# Patient Record
Sex: Male | Born: 1950 | Race: White | Hispanic: No | Marital: Married | Smoking: Never smoker
Health system: Southern US, Community
[De-identification: ages and names within clinical notes are randomized; demographics above are authoritative.]

## PROBLEM LIST (undated history)

## (undated) DIAGNOSIS — K635 Polyp of colon: Secondary | ICD-10-CM

## (undated) DIAGNOSIS — G4730 Sleep apnea, unspecified: Secondary | ICD-10-CM

## (undated) DIAGNOSIS — I10 Essential (primary) hypertension: Secondary | ICD-10-CM

## (undated) DIAGNOSIS — E119 Type 2 diabetes mellitus without complications: Secondary | ICD-10-CM

## (undated) DIAGNOSIS — E785 Hyperlipidemia, unspecified: Secondary | ICD-10-CM

## (undated) HISTORY — PX: COLONOSCOPY: SHX174

## (undated) HISTORY — DX: Polyp of colon: K63.5

## (undated) HISTORY — DX: Type 2 diabetes mellitus without complications: E11.9

---

## 2000-11-29 ENCOUNTER — Encounter: Admission: RE | Admit: 2000-11-29 | Discharge: 2000-11-29 | Payer: Self-pay | Admitting: Family Medicine

## 2000-11-29 ENCOUNTER — Encounter: Payer: Self-pay | Admitting: Family Medicine

## 2002-08-02 ENCOUNTER — Encounter: Payer: Self-pay | Admitting: Family Medicine

## 2002-08-02 ENCOUNTER — Encounter: Admission: RE | Admit: 2002-08-02 | Discharge: 2002-08-02 | Payer: Self-pay | Admitting: Family Medicine

## 2002-11-13 ENCOUNTER — Encounter: Admission: RE | Admit: 2002-11-13 | Discharge: 2002-11-13 | Payer: Self-pay | Admitting: Family Medicine

## 2002-11-13 ENCOUNTER — Encounter: Payer: Self-pay | Admitting: Family Medicine

## 2004-05-13 ENCOUNTER — Ambulatory Visit: Payer: Self-pay | Admitting: Unknown Physician Specialty

## 2004-05-13 ENCOUNTER — Ambulatory Visit (HOSPITAL_BASED_OUTPATIENT_CLINIC_OR_DEPARTMENT_OTHER): Admission: RE | Admit: 2004-05-13 | Discharge: 2004-05-13 | Payer: Self-pay | Admitting: Unknown Physician Specialty

## 2004-06-08 ENCOUNTER — Ambulatory Visit (HOSPITAL_BASED_OUTPATIENT_CLINIC_OR_DEPARTMENT_OTHER): Admission: RE | Admit: 2004-06-08 | Discharge: 2004-06-08 | Payer: Self-pay | Admitting: Unknown Physician Specialty

## 2004-06-15 ENCOUNTER — Ambulatory Visit: Payer: Self-pay | Admitting: Internal Medicine

## 2005-11-16 ENCOUNTER — Ambulatory Visit: Payer: Self-pay | Admitting: Internal Medicine

## 2005-12-07 ENCOUNTER — Ambulatory Visit: Payer: Self-pay | Admitting: Internal Medicine

## 2005-12-07 ENCOUNTER — Encounter (INDEPENDENT_AMBULATORY_CARE_PROVIDER_SITE_OTHER): Payer: Self-pay | Admitting: *Deleted

## 2009-01-16 ENCOUNTER — Encounter: Admission: RE | Admit: 2009-01-16 | Discharge: 2009-01-16 | Payer: Self-pay | Admitting: Family Medicine

## 2009-10-17 ENCOUNTER — Encounter: Admission: RE | Admit: 2009-10-17 | Discharge: 2009-10-17 | Payer: Self-pay | Admitting: Family Medicine

## 2010-03-04 ENCOUNTER — Encounter
Admission: RE | Admit: 2010-03-04 | Discharge: 2010-03-04 | Payer: Self-pay | Source: Home / Self Care | Attending: Family Medicine | Admitting: Family Medicine

## 2010-04-01 ENCOUNTER — Encounter
Admission: RE | Admit: 2010-04-01 | Discharge: 2010-04-01 | Payer: Self-pay | Source: Home / Self Care | Attending: Chiropractic Medicine | Admitting: Chiropractic Medicine

## 2010-07-18 NOTE — Procedures (Signed)
NAME:  Zachary Dunn, Zachary Dunn             ACCOUNT NO.:  [id number]   MEDICAL RECORD NO.:  [id number]          PATIENT TYPE:  OUT   LOCATION:  SLEEP CENTER                 FACILITY:  [facility]   PHYSICIAN:  Clinton D. Maple Hudson, M.D. DATE OF BIRTH:  [birth year]   DATE OF STUDY:  05/13/2004                              NOCTURNAL POLYSOMNOGRAM   REFERRING PHYSICIAN:  Dr. Theadora Rama.   INDICATIONS FOR STUDY:  Hypersomnia with sleep apnea. Epworth sleepiness  score 9/24, BMI 38, weight 238 pounds.   SLEEP ARCHITECTURE:  Short sleep time 245 minutes with sleep efficiency of  58%. Stage I was 7%, stage II was 85%, stages III and IV were absent, REM  was 7% of total sleep time. Sleep latency 58 minutes. REM latency 145  minutes. Awake after sleep onset 123 minutes. Arousal index 41, which is  increased. The patient did not bring Ambien which he usually takes for sleep  and reported that he could not sleep supine without it.   RESPIRATORY DATA:  Respiratory disturbance index (RDI) 13 per hour  indicating mild obstructive sleep apnea/hypopnea syndrome before CPAP  indicating mild obstructive sleep apnea/hypopnea syndrome. This included 11  obstructive apneas and 42 hypopneas. The events were not positional. REM RDI  of 46.  CPAP titration by split protocol could not utilized on this night  because of insufficiency early events to meet protocol criteria.   OXYGEN DATA:  Moderate to loud snoring with oxygen desaturation to a nadir  of 87%. Mean oxygen saturation through the study was 94% on room air.   CARDIAC DATA:  Normal sinus rhythm.   MOVEMENT/PARASOMNIA:  A total of 125 limb jerks were recorded of which 36  were associated with arousal or awakening for a periodic limb movement with  arousal index of 8.8 per hour.   IMPRESSION/RECOMMENDATIONS:  1.  Mild obstructive sleep apnea/hypopnea syndrome, RDI of 13 per hour with      moderate snoring and oxygen desaturation to 87%.  2.  Periodic limb  movement with arousal, 8.8 per hour.  3.  Consider return for CPAP titration or evaluate for alternative sleep      apnea therapies as appropriate.    CDY/MEDQ  D:  05/18/2004 11:52:07  T:  05/19/2004 09:53:56  Job:  [id number]

## 2010-07-18 NOTE — Procedures (Signed)
NAME:  Zachary Dunn, Zachary Dunn             ACCOUNT NO.:  [id number]   MEDICAL RECORD NO.:  [id number]          PATIENT TYPE:  OUT   LOCATION:  SLEEP CENTER                 FACILITY:  [facility]   PHYSICIAN:  Clinton D. Maple Hudson, M.D. DATE OF BIRTH:  1950-04-06   DATE OF STUDY:  06/08/2004                              NOCTURNAL POLYSOMNOGRAM   STUDY DATE:  June 08, 2004   REFERRING PHYSICIAN:  Dr. Theadora Rama   INDICATION FOR STUDY:  Hypersomnia with sleep apnea.  Epworth Sleepiness  Score 6/24, BMI 42, weight 294 pounds.  Baseline diagnostic NPSG on May 13, 2004 recorded RDI of 13 per hour.  CPAP titration is requested.   SLEEP ARCHITECTURE:  Total sleep time 280 minutes (76%).  Stage I was 6%,  stage II 69%, stages III and IV were absent, REM was 26% of total sleep  time.  Sleep latency 47 minutes, REM latency 65 minutes, awake after sleep  onset 37 minutes, arousal index increased at 46.   SLEEP ARCHITECTURE:  CPAP titration protocol.  CPAP was titrated to 11 CWP,  RDI 6.5 per hour using a large ResMed Ultra Mirage Mask with heated  humidifier.   OXYGEN DATA:  Snoring was eliminated at 11 CWP.  Mean oxygen saturation on  CPAP was 95-98%.   CARDIAC DATA:  Normal sinus rhythm.   MOVEMENT/PARASOMNIA:  A total of 196 limb jerks were recorded of which 60  were associated with arousal or awakening for a periodic limb movement with  arousal index of 12.9 per hour which is increased.   IMPRESSION/RECOMMENDATION:  1.  Successful continuous positive airway pressure titration to 11 CWP,      respiratory disturbance index 6.5 per hour, using a large ResMed Ultra      Mirage Mask with heated humidifier.  2.  Baseline diagnostic nocturnal polysomnogram on May 13, 2004 recorded      respiratory disturbance index of 13 per hour.  3.  Periodic limb movement with arousal, 12.9 per hour.  This can be      evaluated clinically after time for      accommodation to home continuous positive airway  pressure.  If it      persists then consider specific treatment with clonazepam or Requip.      CDY/MEDQ  D:  06/15/2004 14:47:54  T:  06/15/2004 19:07:10  Job:  [id number]

## 2011-01-21 ENCOUNTER — Encounter: Payer: Self-pay | Admitting: Internal Medicine

## 2011-03-19 ENCOUNTER — Encounter: Payer: BC Managed Care – PPO | Attending: Internal Medicine | Admitting: Dietician

## 2011-03-19 DIAGNOSIS — E119 Type 2 diabetes mellitus without complications: Secondary | ICD-10-CM | POA: Insufficient documentation

## 2011-03-19 DIAGNOSIS — Z713 Dietary counseling and surveillance: Secondary | ICD-10-CM | POA: Insufficient documentation

## 2011-03-19 NOTE — Progress Notes (Signed)
  Medical Nutrition Therapy:  Appt start time: 1115 end time:  1225  Assessment:  Primary concerns today: Increased A1C or 7.6%. History of type 2 DM for 7-8 months.  Has seen an increase in his A1C level over that period.  Primary MD referred to endocrinologist, who when he saw him recommended a visit with the CDE.  He has been doing Weight Watchers for the last 4 years.  He does not attend the meetings but, follows his point program and regularly weights himself at home.  He has lost approximately 30 lbs over this period.  He has not been ask to check his blood glucose on a regular basis.  He has bought a meter and will check occasionally.  Has family history of Father who developed DM 2 late in life.  Has been on the Janumet for the last 6 months.  MEDICATIONS: DM= Janumet 50/1000 BID Cholesterol= Lipitor.  DIETARY INTAKE:  24-hr recall:  B (8:00 AM): Oatmeal 1 cup (stee cut) banana or 1/2 grapefruit OR [place] with egg beater, with mushrooms onion, green pepper, 12 regular bagel and 2 cup black coffee.  Snk (mid AM) :none  L (mid PM): Salad OR leftovers Celery 4-5 sticks with low fat PB and a cup of tomato soup, V8 juice 8 oz,  Drink 4 bottles (16 oz ) each day.  Snk (mid PM): Usually no snacks. D (6:30-7:00 PM): Boiled shrimp,  8 med. Shrimp, salad (side) vinegar and olive oil, slice of ww bread with butter.  Snk (Bedtime PM): Usually 1/2 can no sugar added peaches. Beverages: water,   coffee and skim 2 cups per day   Recent physical activity: Goes to the gym 3-4 days per week and does primarily aerobic and stretching exercises  Estimated energy needs: Ht: 71 in  Wt: 265.9 lb BMI: 37.2 %  Adj WT: 207 lb   1800-1900 calories 200-205 g carbohydrates 130-135 g protein 48-50 g fat  Progress Towards Goal(s):  In progress.   Nutritional Diagnosis:  Cygnet-2.1 Inpaired nutrition utilization As related to glucose and carbohydrate imbalance.  As evidenced by A1C increased to 7.6% with diagnosis of  DM type 2.    Intervention:  Nutrition To continue to monitor food intake especially carbohydrate ntake.  When using Weight Watchers, do not limit the points and carbs during the day and then use or spend them with a large dinner meal and heavy evening snacking. Exercise:  Increase exercise to 150 minutes per week.  Talk to MD regarding regularly checking blood glucose levels.  Check to see what meals and exercise are doing to your blood glucose levels.  Handouts given during visit include:  Living Well With Diabetes  Novo Nordisk Carbohydrate Counting Guide  Handout for glucose level guide , exercise, meal planning and interventions.   Monitoring/Evaluation:  Dietary intake, exercise, blood glucose, and body weight in the next 6 months or he can call with questions in the meantime.

## 2011-03-19 NOTE — Patient Instructions (Signed)
   Check your glucose more often,  Vary the times during the day.  At the 2:30 Am hunger time, check your glucose before you eat.  Record you glucose levels and continue to try to correlate the glucose with food, activity, stress, medications.   Try not to save the points for 1 large meal.  Rice try [facility] ric, keep at 1 cup for meal (lean protein, 1-2 of non-starchy veggies, small serving of fat,  1 starch serving at the meal.  Keep the bedtime snack .   Talk to pharmacist for grapefruit interaction with meds.   Try to keep the carbs at 60 gnm for meals and 1 gm for snacks.

## 2011-03-21 ENCOUNTER — Encounter: Payer: Self-pay | Admitting: Dietician

## 2011-10-07 ENCOUNTER — Emergency Department (HOSPITAL_COMMUNITY)
Admission: EM | Admit: 2011-10-07 | Discharge: 2011-10-07 | Disposition: A | Discharge status: Home / Self Care | Payer: BC Managed Care – PPO | Attending: Emergency Medicine | Admitting: Emergency Medicine

## 2011-10-07 ENCOUNTER — Encounter (HOSPITAL_COMMUNITY): Payer: Self-pay | Admitting: Emergency Medicine

## 2011-10-07 DIAGNOSIS — IMO0002 Reserved for concepts with insufficient information to code with codable children: Secondary | ICD-10-CM

## 2011-10-07 DIAGNOSIS — I10 Essential (primary) hypertension: Secondary | ICD-10-CM | POA: Insufficient documentation

## 2011-10-07 DIAGNOSIS — S61409A Unspecified open wound of unspecified hand, initial encounter: Secondary | ICD-10-CM | POA: Insufficient documentation

## 2011-10-07 DIAGNOSIS — W260XXA Contact with knife, initial encounter: Secondary | ICD-10-CM | POA: Insufficient documentation

## 2011-10-07 HISTORY — DX: Essential (primary) hypertension: I10

## 2011-10-07 MED ORDER — IBUPROFEN 800 MG PO TABS
800.0000 mg | ORAL_TABLET | Freq: Once | ORAL | Status: AC
Start: 2011-10-07 — End: 2011-10-07
  Administered 2011-10-07: 800 mg via ORAL
  Filled 2011-10-07: qty 1

## 2011-10-07 MED ORDER — TETANUS-DIPHTH-ACELL PERTUSSIS 5-2.5-18.5 LF-MCG/0.5 IM SUSP
0.5000 mL | Freq: Once | INTRAMUSCULAR | Status: AC
  Administered 2011-10-07: 0.5 mL via INTRAMUSCULAR
  Filled 2011-10-07: qty 0.5

## 2011-10-07 NOTE — ED Notes (Signed)
Pt alert, arrives from home, c/o laceration to left hand, onset today, DSD in place, last tet 2007

## 2011-10-07 NOTE — ED Provider Notes (Signed)
History     CSN: [id number]  Arrival date & time 10/07/11  2014   First MD Initiated Contact with Patient 10/07/11 2157      Chief Complaint  Patient presents with  . Laceration  . Hand Pain    (Consider location/radiation/quality/duration/timing/severity/associated sxs/prior treatment) HPI Comments: Patient cut left hand between thumb and 2nd finger at around 1pm with rusty pocket knife. Rinsed with water right after. Patient states bleeding continued for approximately 2 hours afterwards. Tetanus last placed in 2007. No other treatments prior to arrival. Patient denies numbness, tingling, or weakness in his hand. No other injury. Onset was acute. Course constant. Nothing makes symptoms better or worse.  Patient is a 61 y.o. male presenting with skin laceration. The history is provided by the patient.  Laceration  The incident occurred 6 to 12 hours ago. The laceration is located on the left hand. The laceration is 1 cm in size. The laceration mechanism was a a dirty knife. The pain is mild. The pain has been constant since onset. He reports no foreign bodies present. His tetanus status is out of date.    Past Medical History  Diagnosis Date  . Hypertension     History reviewed. No pertinent past surgical history.  No family history on file.  History  Substance Use Topics  . Smoking status: Never Smoker   . Smokeless tobacco: Never Used  . Alcohol Use: No      Review of Systems  Constitutional: Negative for activity change.  Musculoskeletal: Negative for joint swelling and arthralgias.  Skin: Positive for wound.  Neurological: Negative for weakness and numbness.    Allergies  Review of patient's allergies indicates no known allergies.  Home Medications   Current Outpatient Rx  Name Route Sig Dispense Refill  . ASPIRIN 81 MG PO CHEW Oral Chew 81 mg by mouth daily.    . ATORVASTATIN CALCIUM 10 MG PO TABS Oral Take 10 mg by mouth daily.    Marland Kitchen LISINOPRIL 20 MG PO  TABS Oral Take 20 mg by mouth daily.    Marland Kitchen SITAGLIPTIN-METFORMIN HCL 50-1000 MG PO TABS Oral Take 1 tablet by mouth 2 (two) times daily with a meal.      BP 150/86  Pulse 98  Temp 98 F (36.7 C)  Resp 16  SpO2 99%  Physical Exam  Nursing note and vitals reviewed. Constitutional: He appears well-developed and well-nourished.  HENT:  Head: Normocephalic and atraumatic.  Eyes: Conjunctivae are normal.  Neck: Normal range of motion. Neck supple.  Pulmonary/Chest: No respiratory distress.  Musculoskeletal: He exhibits tenderness.       Left hand: He exhibits tenderness and laceration. He exhibits normal range of motion, no bony tenderness, normal capillary refill, no deformity and no swelling. normal sensation noted. Normal strength noted.       Hands:      Wound fully explored, wound depth approximately 1/4 of an inch. No foreign bodies seen or palpated.   Neurological: He is alert.  Skin: Skin is warm and dry.  Psychiatric: He has a normal mood and affect.    ED Course  Procedures (including critical care time)  Labs Reviewed - No data to display No results found.   1. Laceration     10:02 PM Patient seen and examined. Medications ordered.   Vital signs reviewed and are as follows: Filed Vitals:   10/07/11 2033  BP: 150/86  Pulse: 98  Temp: 98 F (36.7 C)  Resp: 16  LACERATION REPAIR Performed by: Carolee Rota Authorized by: Carolee Rota Consent: Verbal consent obtained. Risks and benefits: risks, benefits and alternatives were discussed Consent given by: patient Patient identity confirmed: provided demographic data Prepped and Draped in normal sterile fashion Wound explored  Laceration Location: L hand  Laceration Length: 1cm  No Foreign Bodies seen or palpated  Anesthesia: local infiltration  Local anesthetic: lidocaine 2% with epinephrine  Anesthetic total: 2 ml  Irrigation method: syringe with splash guard Amount of cleaning: standard,  500cc saline  Skin closure: 5-0 Ethilon  Number of sutures: 2  Technique: simple interrupted  Patient tolerance: Patient tolerated the procedure well with no immediate complications.  The patient was urged to return to the Emergency Department urgently with worsening pain, swelling, expanding erythema especially if it streaks away from the affected area, fever, or if they have any other concerns. Patient verbalized understanding.   Counseled patient on need for return and suture removal in 7-10 days.   MDM  Patient with 1 cm, shallow, non-puncture laceration of left hand with rusty pocket knife. Tetanus was updated. Wound was fully explored and irrigated with saline under pressure. No tendon, vascular, or neurological injury suspected during exam. No FB seen or palpated. Patient counseled on signs and symptoms to return. Wound repaired without immediate complication.       Renne Crigler, [place] 10/08/11 [id number]

## 2011-10-08 NOTE — ED Provider Notes (Signed)
Medical screening examination/treatment/procedure(s) were performed by non-physician practitioner and as supervising physician I was immediately available for consultation/collaboration.   Loren Racer, MD 10/08/11 [phone]

## 2011-11-25 ENCOUNTER — Encounter: Payer: Self-pay | Admitting: Internal Medicine

## 2012-01-20 IMAGING — CR DG CERVICAL SPINE COMPLETE 4+V
6 series · 6 of 6 positions shown · non-contrast
Comparison: Head CT 01/16/2009.

CLINICAL DATA: 59-year-old male with neck pain greater on the left.
No known injury.

CERVICAL SPINE - COMPLETE 4+ VIEW

[view not recorded (1 of 6)]
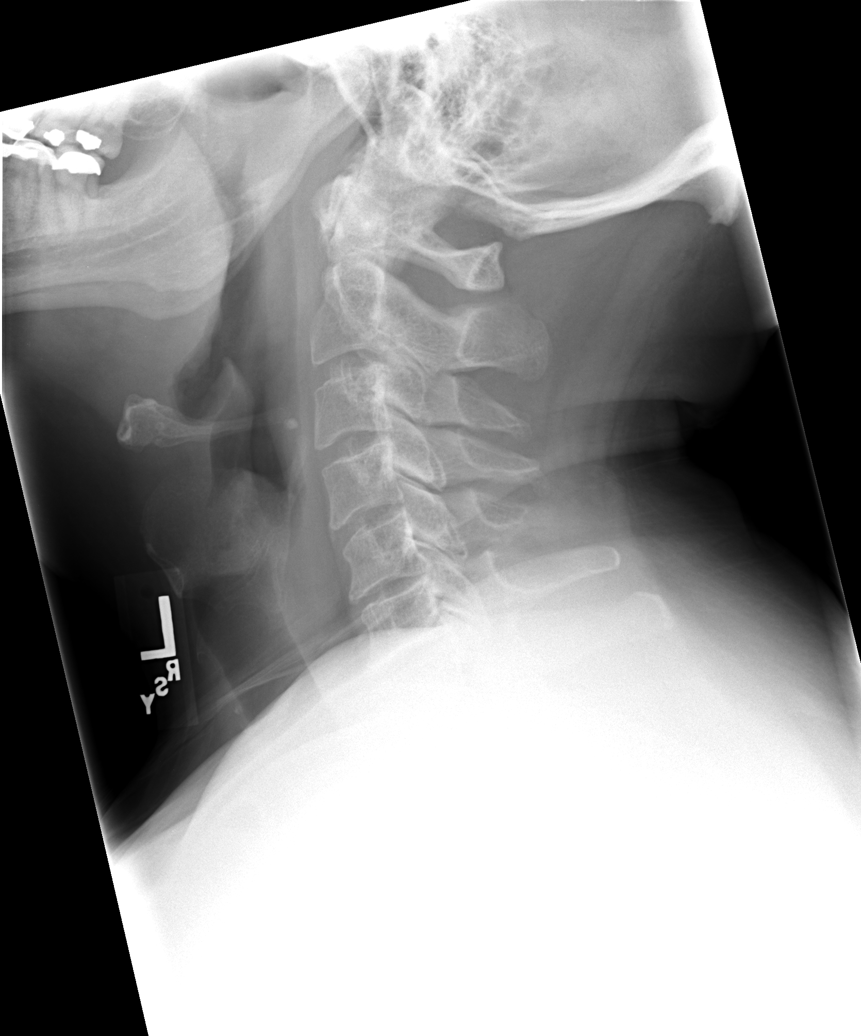

[view not recorded (2 of 6)]
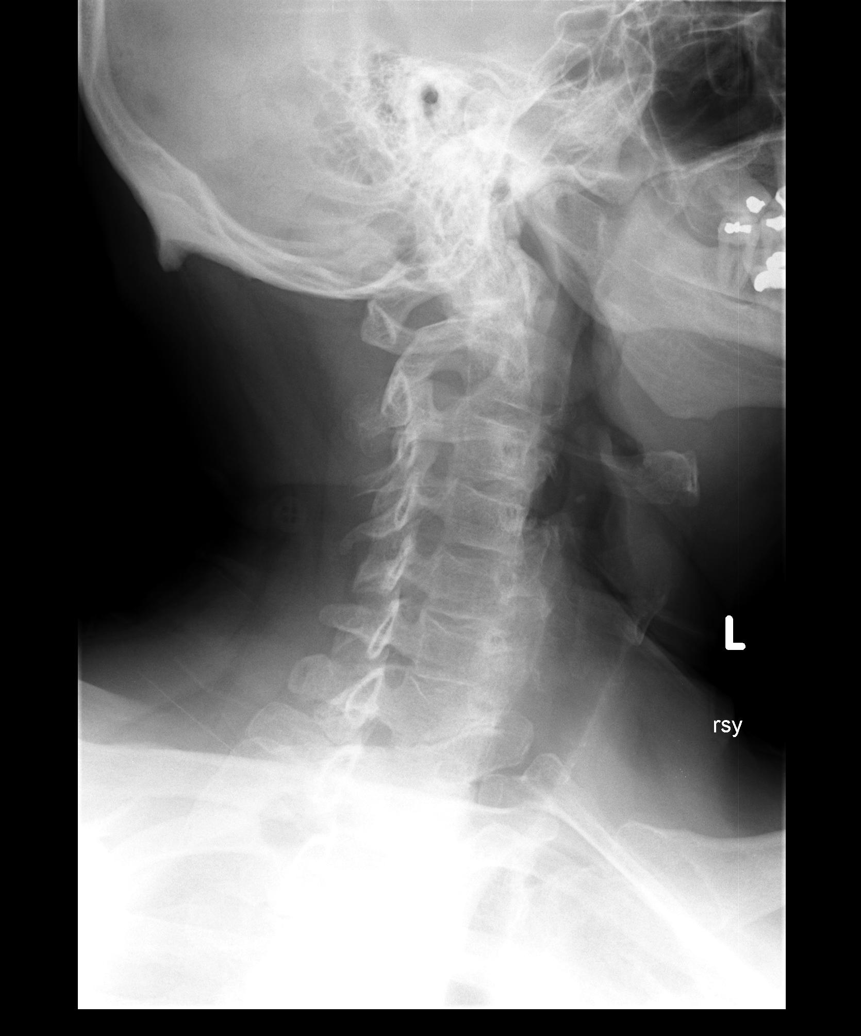

[view not recorded (3 of 6)]
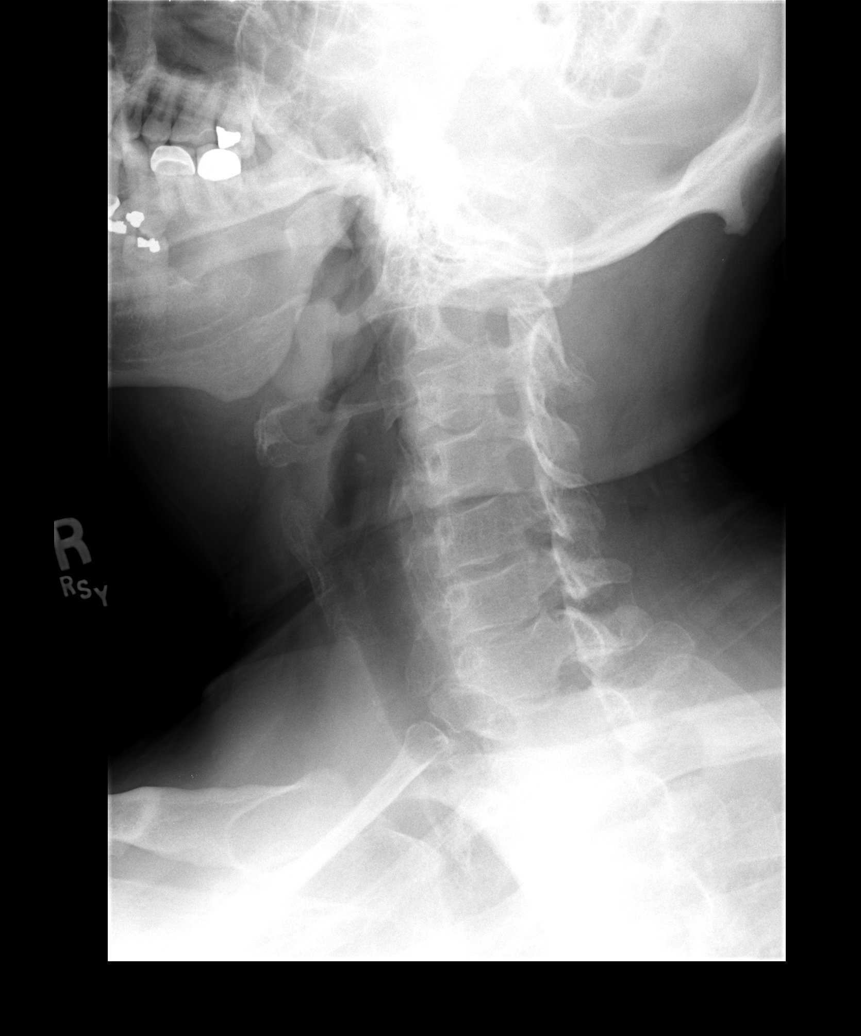

[view not recorded (4 of 6)]
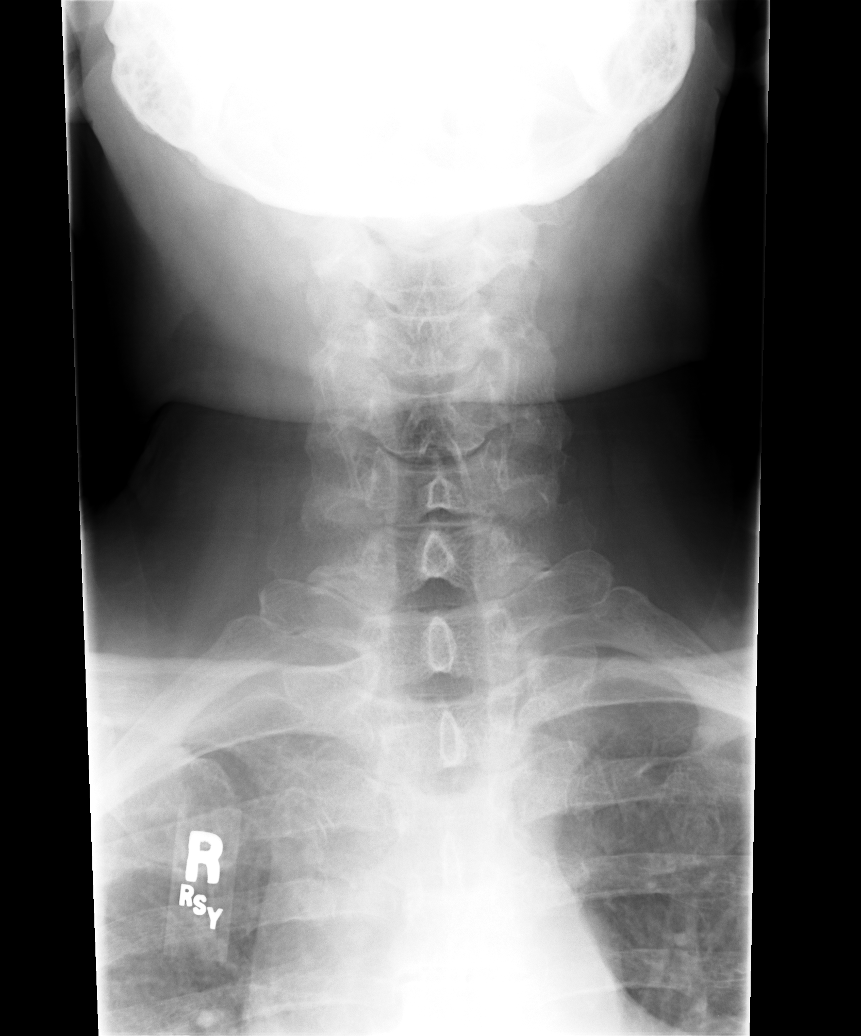

[view not recorded (5 of 6)]
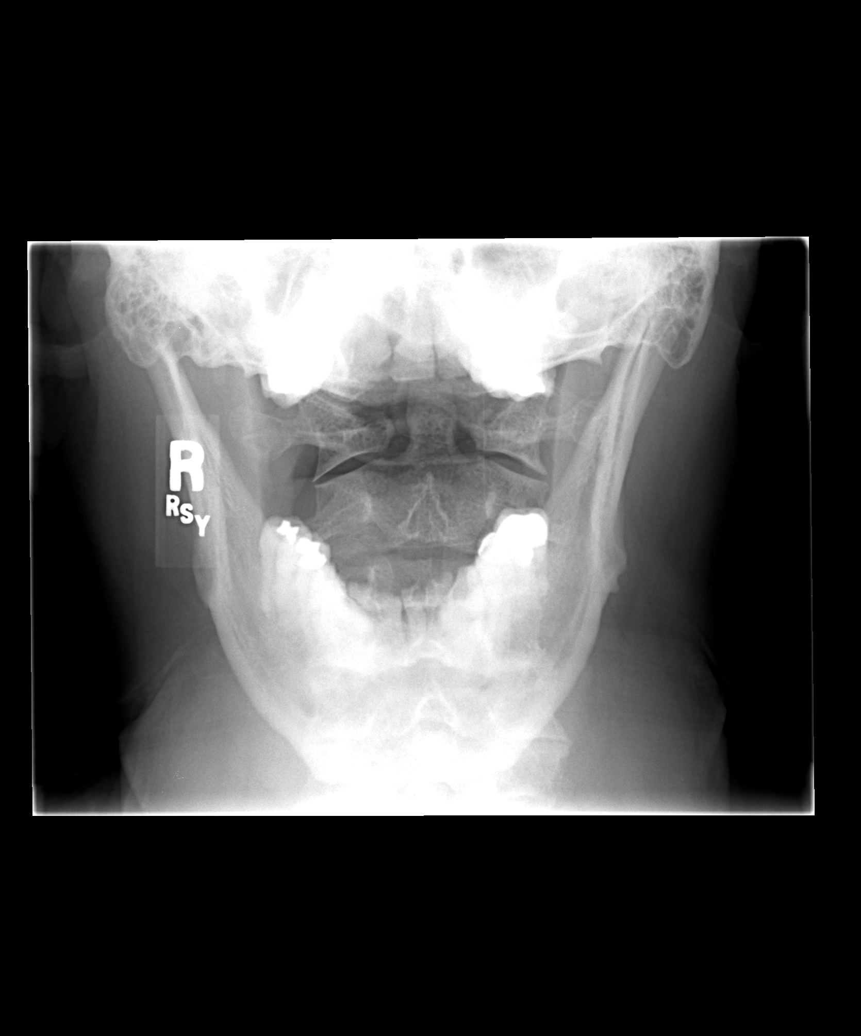

[view not recorded (6 of 6)]
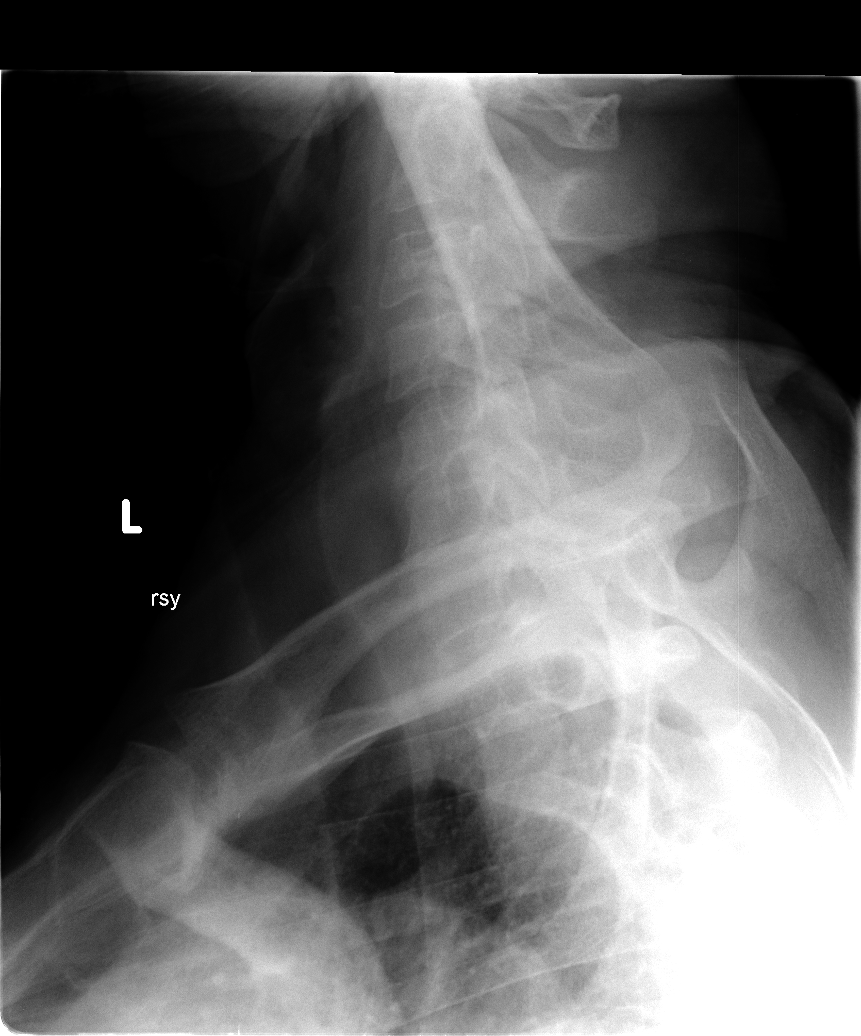

[6 of 6 positions shown; findings below may reference images not displayed]

FINDINGS: Prevertebral soft tissues are within normal limits.
Bilateral posterior element alignment is within normal limits.
Lung apices and AP alignment within normal limits.  C1-C2 alignment
and odontoid process within normal limits. Cervicothoracic junction
alignment is within normal limits.  Relatively preserved cervical
disc spaces.
IMPRESSION: No acute osseous abnormality in the cervical spine.

## 2013-01-20 ENCOUNTER — Encounter: Payer: Self-pay | Admitting: Internal Medicine

## 2013-03-30 ENCOUNTER — Encounter: Payer: BC Managed Care – PPO | Admitting: Internal Medicine

## 2013-04-13 ENCOUNTER — Ambulatory Visit (AMBULATORY_SURGERY_CENTER): Payer: Self-pay

## 2013-04-13 VITALS — Ht 70.5 in | Wt 260.0 lb

## 2013-04-13 DIAGNOSIS — Z860100 Personal history of colon polyps, unspecified: Secondary | ICD-10-CM

## 2013-04-13 MED ORDER — SUPREP BOWEL PREP KIT 17.5-3.13-1.6 GM/177ML PO SOLN: 1.0000 | Freq: Once | ORAL | Status: DC

## 2013-04-21 ENCOUNTER — Encounter: Payer: Self-pay | Admitting: Internal Medicine

## 2013-04-27 ENCOUNTER — Encounter: Payer: BC Managed Care – PPO | Admitting: Internal Medicine

## 2013-06-16 ENCOUNTER — Encounter: Payer: BC Managed Care – PPO | Admitting: Internal Medicine

## 2013-06-20 ENCOUNTER — Ambulatory Visit (AMBULATORY_SURGERY_CENTER): Payer: BC Managed Care – PPO | Admitting: Internal Medicine

## 2013-06-20 ENCOUNTER — Encounter: Payer: Self-pay | Admitting: Internal Medicine

## 2013-06-20 VITALS — BP 121/68 | HR 72 | Temp 99.0°F | Resp 37 | Ht 70.5 in | Wt 260.0 lb

## 2013-06-20 DIAGNOSIS — D126 Benign neoplasm of colon, unspecified: Secondary | ICD-10-CM

## 2013-06-20 DIAGNOSIS — Z860100 Personal history of colon polyps, unspecified: Secondary | ICD-10-CM | POA: Insufficient documentation

## 2013-06-20 DIAGNOSIS — K5730 Diverticulosis of large intestine without perforation or abscess without bleeding: Secondary | ICD-10-CM

## 2013-06-20 MED ORDER — SODIUM CHLORIDE 0.9 % IV SOLN: 500.0000 mL | INTRAVENOUS | Status: DC

## 2013-06-20 NOTE — Progress Notes (Signed)
Called to room to assist during endoscopic procedure.  Patient ID and intended procedure confirmed with present staff. Received instructions for my participation in the procedure from the performing physician.  

## 2013-06-20 NOTE — Op Note (Signed)
[place]  [address]. [place], [id number]   COLONOSCOPY PROCEDURE REPORT  PATIENT: Zachary, Dunn  MR#: [id number] BIRTHDATE: [birth year] , 62  yrs. old GENDER: Male ENDOSCOPIST: Gatha Mayer, MD, [facility] PROCEDURE DATE:  06/20/2013 PROCEDURE:   Colonoscopy with snare polypectomy First Screening Colonoscopy - Avg.  risk and is 50 yrs.  old or older - No.  Prior Negative Screening - Now for repeat screening. N/A  History of Adenoma - Now for follow-up colonoscopy & has been > or = to 3 yrs.  Yes hx of adenoma.  Has been 3 or more years since last colonoscopy.  Polyps Removed Today? Yes. ASA CLASS:   Class II INDICATIONS:Patient's personal history of adenomatous colon polyps.  MEDICATIONS: propofol (Diprivan) 200mg  IV, MAC sedation, administered by CRNA, and These medications were titrated to patient response per physician's verbal order  DESCRIPTION OF PROCEDURE:   After the risks benefits and alternatives of the procedure were thoroughly explained, informed consent was obtained.  A digital rectal exam revealed no abnormalities of the rectum and A digital rectal exam revealed the prostate was not enlarged.   The LB [id number]  endoscope was introduced through the anus and advanced to the cecum, which was identified by both the appendix and ileocecal valve. No adverse events experienced.   The quality of the prep was Suprep good  The instrument was then slowly withdrawn as the colon was fully examined.  COLON FINDINGS: Three sessile polyps measuring 3, 5 and 8 mm in size were found at the hepatic flexure (2) and in the transverse colon. A polypectomy was performed with a cold snare.  The resection was complete and the polyp tissue was completely retrieved.   Moderate diverticulosis was noted in the sigmoid colon.   The colon mucosa was otherwise normal.  Retroflexed views revealed no abnormalities. The time to cecum=3 minutes 28 seconds.  Withdrawal  time=9 minutes 40 seconds.  The scope was withdrawn and the procedure completed. COMPLICATIONS: There were no complications.  ENDOSCOPIC IMPRESSION: 1.   Three sessile polyps measuring 3, 5 and 8 mm in size were found at the hepatic flexure and in the transverse colon; polypectomy was performed with a cold snare 2.   Moderate diverticulosis was noted in the sigmoid colon 3.   The colon mucosa was otherwise normal - good prep  RECOMMENDATIONS: Timing of repeat colonoscopy will be determined by pathology findings in patient with prior adenoma removed 2008   eSigned:  Gatha Mayer, MD, [facility] 06/20/2013 11:44 AM  cc: The Patient  and Anastasia Pall, MD

## 2013-06-20 NOTE — Patient Instructions (Addendum)
I found and removed 3 polyps that look benign.  You also have a condition called diverticulosis - common and not usually a problem. Please read the handout provided.  I will let you know pathology results and when to have another routine colonoscopy by mail. Gatha Mayer, MD, FACG   YOU HAD AN ENDOSCOPIC PROCEDURE TODAY AT [facility]: Refer to the procedure report that was given to you for any specific questions about what was found during the examination.  If the procedure report does not answer your questions, please call your gastroenterologist to clarify.  If you requested that your care partner not be given the details of your procedure findings, then the procedure report has been included in a sealed envelope for you to review at your convenience later.  YOU SHOULD EXPECT: Some feelings of bloating in the abdomen. Passage of more gas than usual.  Walking can help get rid of the air that was put into your GI tract during the procedure and reduce the bloating. If you had a lower endoscopy (such as a colonoscopy or flexible sigmoidoscopy) you may notice spotting of blood in your stool or on the toilet paper. If you underwent a bowel prep for your procedure, then you may not have a normal bowel movement for a few days.  DIET: Your first meal following the procedure should be a light meal and then it is ok to progress to your normal diet.  A half-sandwich or bowl of soup is an example of a good first meal.  Heavy or fried foods are harder to digest and may make you feel nauseous or bloated.  Likewise meals heavy in dairy and vegetables can cause extra gas to form and this can also increase the bloating.  Drink plenty of fluids but you should avoid alcoholic beverages for 24 hours.  ACTIVITY: Your care partner should take you home directly after the procedure.  You should plan to take it easy, moving slowly for the rest of the day.  You can resume normal activity the day after the  procedure however you should NOT DRIVE or use heavy machinery for 24 hours (because of the sedation medicines used during the test).    SYMPTOMS TO REPORT IMMEDIATELY: A gastroenterologist can be reached at any hour.  During normal business hours, 8:30 AM to 5:00 PM Monday through Friday, call [phone].  After hours and on weekends, please call the GI answering service at [phone] who will take a message and have the physician on call contact you.   Following lower endoscopy (colonoscopy or flexible sigmoidoscopy):  Excessive amounts of blood in the stool  Significant tenderness or worsening of abdominal pains  Swelling of the abdomen that is new, acute  Fever of 100F or higher  FOLLOW UP: If any biopsies were taken you will be contacted by phone or by letter within the next 1-3 weeks.  Call your gastroenterologist if you have not heard about the biopsies in 3 weeks.  Our staff will call the home number listed on your records the next business day following your procedure to check on you and address any questions or concerns that you may have at that time regarding the information given to you following your procedure. This is a courtesy call and so if there is no answer at the home number and we have not heard from you through the emergency physician on call, we will assume that you have returned to your regular daily  activities without incident.  SIGNATURES/CONFIDENTIALITY: You and/or your care partner have signed paperwork which will be entered into your electronic medical record.  These signatures attest to the fact that that the information above on your After Visit Summary has been reviewed and is understood.  Full responsibility of the confidentiality of this discharge information lies with you and/or your care-partner.

## 2013-06-20 NOTE — Progress Notes (Signed)
Lidocaine-40mg IV prior to Propofol InductionPropofol given over incremental dosages 

## 2013-06-21 ENCOUNTER — Telehealth: Payer: Self-pay

## 2013-06-21 NOTE — Telephone Encounter (Signed)
  Follow up Call-  Call back number 06/20/2013  Post procedure Call Back phone  # [phone]  Permission to leave phone message Yes     Patient questions:  Do you have a fever, pain , or abdominal swelling? no Pain Score  0 *  Have you tolerated food without any problems? yes  Have you been able to return to your normal activities? yes  Do you have any questions about your discharge instructions: Diet   no Medications  no Follow up visit  no  Do you have questions or concerns about your Care? no  Actions: * If pain score is 4 or above: No action needed, pain <4.  No problems per the pt. Maw

## 2013-06-26 ENCOUNTER — Encounter: Payer: Self-pay | Admitting: Internal Medicine

## 2013-06-26 NOTE — Progress Notes (Signed)
Quick Note:  Tubular adenomas/sessile serrated polyps repeat colonoscopy 2018 ______

## 2014-02-16 ENCOUNTER — Other Ambulatory Visit (HOSPITAL_COMMUNITY): Payer: Self-pay | Admitting: Orthopaedic Surgery

## 2014-02-19 ENCOUNTER — Encounter (HOSPITAL_COMMUNITY): Payer: Self-pay

## 2014-02-19 ENCOUNTER — Encounter (HOSPITAL_COMMUNITY)
Admission: RE | Admit: 2014-02-19 | Discharge: 2014-02-19 | Disposition: A | Discharge status: Home / Self Care | Payer: BC Managed Care – PPO | Source: Ambulatory Visit | Attending: Orthopaedic Surgery | Admitting: Orthopaedic Surgery

## 2014-02-19 DIAGNOSIS — Z7982 Long term (current) use of aspirin: Secondary | ICD-10-CM | POA: Diagnosis not present

## 2014-02-19 DIAGNOSIS — I509 Heart failure, unspecified: Secondary | ICD-10-CM | POA: Diagnosis not present

## 2014-02-19 DIAGNOSIS — E119 Type 2 diabetes mellitus without complications: Secondary | ICD-10-CM | POA: Diagnosis not present

## 2014-02-19 DIAGNOSIS — I252 Old myocardial infarction: Secondary | ICD-10-CM | POA: Diagnosis not present

## 2014-02-19 DIAGNOSIS — Z79899 Other long term (current) drug therapy: Secondary | ICD-10-CM | POA: Diagnosis not present

## 2014-02-19 DIAGNOSIS — E669 Obesity, unspecified: Secondary | ICD-10-CM | POA: Diagnosis not present

## 2014-02-19 DIAGNOSIS — M1990 Unspecified osteoarthritis, unspecified site: Secondary | ICD-10-CM | POA: Diagnosis not present

## 2014-02-19 DIAGNOSIS — Z6835 Body mass index (BMI) 35.0-35.9, adult: Secondary | ICD-10-CM | POA: Diagnosis not present

## 2014-02-19 DIAGNOSIS — M7550 Bursitis of unspecified shoulder: Secondary | ICD-10-CM | POA: Diagnosis not present

## 2014-02-19 DIAGNOSIS — E785 Hyperlipidemia, unspecified: Secondary | ICD-10-CM | POA: Diagnosis not present

## 2014-02-19 DIAGNOSIS — I10 Essential (primary) hypertension: Secondary | ICD-10-CM | POA: Diagnosis not present

## 2014-02-19 DIAGNOSIS — M75101 Unspecified rotator cuff tear or rupture of right shoulder, not specified as traumatic: Secondary | ICD-10-CM | POA: Diagnosis present

## 2014-02-19 DIAGNOSIS — G4730 Sleep apnea, unspecified: Secondary | ICD-10-CM | POA: Diagnosis not present

## 2014-02-19 HISTORY — DX: Sleep apnea, unspecified: G47.30

## 2014-02-19 HISTORY — DX: Hyperlipidemia, unspecified: E78.5

## 2014-02-19 LAB — BASIC METABOLIC PANEL
ANION GAP: 17 — AB (ref 5–15)
BUN: 22 mg/dL (ref 6–23)
CHLORIDE: 97 meq/L (ref 96–112)
CO2: 23 meq/L (ref 19–32)
CREATININE: 0.64 mg/dL (ref 0.50–1.35)
Calcium: 9.6 mg/dL (ref 8.4–10.5)
GFR calc Af Amer: >90 mL/min (ref >90)
GFR calc non Af Amer: >90 mL/min (ref >90)
Glucose, Bld: 256 mg/dL — ABNORMAL HIGH (ref 70–99)
Potassium: 4.5 mEq/L (ref 3.7–5.3)
Sodium: 137 mEq/L (ref 137–147)

## 2014-02-19 LAB — CBC
HCT: 46.8 % (ref 39.0–52.0)
Hemoglobin: 16.4 g/dL (ref 13.0–17.0)
MCH: 29.8 pg (ref 26.0–34.0)
MCHC: 35 g/dL (ref 30.0–36.0)
MCV: 85.1 fL (ref 78.0–100.0)
Platelets: 294 10*3/uL (ref 150–400)
RBC: 5.5 MIL/uL (ref 4.22–5.81)
RDW: 13.3 % (ref 11.5–15.5)
WBC: 14.2 10*3/uL — AB (ref 4.0–10.5)

## 2014-02-19 NOTE — Progress Notes (Signed)
Anesthesia Chart Review:  Patient is a 63 year old male scheduled for right shoulder arthroscopy with rotator cuff repair and subacromial decompression on 02/21/14 by Dr. Erlinda Hong.  History includes HTN, HLD, OSA with CPAP use, DM2, non-smoker. BMI is consistent with obesity. PCP is listed as Dr. Anastasia Pall.  Meds include Fish oil, ASA, Lipitor, glipizide, lisinopril, metformin, Zanaflex, tramadol.  EKG on 02/19/14: NSR.  Preoperative labs noted. Glucose 256.  He will get a fasting CBG on arrival.  (A1C was 6.9 on 06/30/13--see Care Everywhere.) WBC mildly elevated 14.2. Afebrile at PAT.  If no acute issues and glucose acceptable then I anticipate that he can proceed as planned from an anesthesia standpoint.  George Hugh [facility]/Anesthesiology Phone [phone] 02/19/2014 5:51 PM

## 2014-02-19 NOTE — Pre-Procedure Instructions (Signed)
Zachary Dunn  02/19/2014   Your procedure is scheduled on:  Wednesday February 21, 2014 at 10:30 AM.  Report to [facility] Admitting at 8:30 AM.  Call this number if you have problems the morning of surgery: [phone]   For any other questions Monday-Friday from 8am-4pm call: [phone]   Remember:   Do not eat food or drink liquids after midnight.    Take these medicines the morning of surgery with A SIP OF WATER: Tramadol (Ultram) if needed for pain, and Tizanidine (Zanaflex) if needed   Do NOT take any diabetic medications the morning of your surgery (Ex. Glipizide or Metformin)    Please stop taking fish oil or any Motrin, Advil, Alleve, etc.    Do not wear jewelry.  Do not wear lotions, powders, or colgone.   Men may shave face and neck.  Do not bring valuables to the hospital.  [facility] is not responsible for any belongings or valuables.               Contacts, dentures or bridgework may not be worn into surgery.  Leave suitcase in the car. After surgery it may be brought to your room.  For patients admitted to the hospital, discharge time is determined by your treatment team.               Patients discharged the day of surgery will not be allowed to drive home.  Name and phone number of your driver:   Special Instructions: Shower using CHG soap the night before and the morning of your surgery   Please read over the following fact sheets that you were given: Pain Booklet, Coughing and Deep Breathing, MRSA Information and Surgical Site Infection Prevention

## 2014-02-19 NOTE — Progress Notes (Signed)
PCP is Anastasia Pall. Patient denied having any acute cardiac or pulmonary issues. Patient did inform Nurse that he has sleep apnea and wears a CPAP nightly. Fasting glucose is 120 per patient.

## 2014-02-20 MED ORDER — CEFAZOLIN SODIUM-DEXTROSE 2-3 GM-% IV SOLR
2.0000 g | INTRAVENOUS | Status: DC
  Filled 2014-02-20: qty 50

## 2014-02-20 NOTE — H&P (Signed)
PREOPERATIVE H&P  Chief Complaint: Right shoulder rotator cuff tear  HPI: Zachary Dunn is a 63 y.o. male who presents for surgical treatment of Right shoulder rotator cuff tear.  He denies any changes in medical history.  Past Medical History  Diagnosis Date  . Hypertension   . Colon polyp   . Diabetes mellitus     Type 2  . Hyperlipemia   . Sleep apnea     wears CPAP mask nightly    Past Surgical History  Procedure Laterality Date  . Colonoscopy     History   Social History  . Marital Status: Married    Spouse Name: N/A    Number of Children: N/A  . Years of Education: N/A   Social History Main Topics  . Smoking status: Never Smoker   . Smokeless tobacco: Never Used  . Alcohol Use: Yes     Comment: less than monthly rare beer  . Drug Use: No  . Sexual Activity: Not on file   Other Topics Concern  . Not on file   Social History Narrative   Family History  Problem Relation Age of Onset  . Colon cancer Neg Hx   . Pancreatic cancer Neg Hx   . Stomach cancer Neg Hx    Allergies  Allergen Reactions  . Shrimp [Shellfish Allergy] Rash   Prior to Admission medications   Medication Sig Start Date End Date Taking? Authorizing Provider  aspirin 81 MG chewable tablet Chew 81 mg by mouth daily.   Yes Historical Provider, MD  atorvastatin (LIPITOR) 10 MG tablet Take 10 mg by mouth daily.   Yes Historical Provider, MD  glipiZIDE (GLUCOTROL) 10 MG tablet Take 10 mg by mouth daily before breakfast.   Yes Historical Provider, MD  lisinopril (PRINIVIL,ZESTRIL) 20 MG tablet Take 20 mg by mouth daily.   Yes Historical Provider, MD  metFORMIN (GLUCOPHAGE) 500 MG tablet Take 1,000 mg by mouth 2 (two) times daily with a meal.    Yes Historical Provider, MD  Omega-3 Fatty Acids (FISH OIL) 1000 MG CAPS Take 1 capsule by mouth 2 (two) times daily.    Yes Historical Provider, MD  tiZANidine (ZANAFLEX) 2 MG tablet Take 2 mg by mouth every 8 (eight) hours as needed for muscle  spasms.   Yes Historical Provider, MD  traMADol (ULTRAM) 50 MG tablet Take 50 mg by mouth every 6 (six) hours as needed for moderate pain.   Yes Historical Provider, MD     Positive ROS: All other systems have been reviewed and were otherwise negative with the exception of those mentioned in the HPI and as above.  Physical Exam: General: Alert, no acute distress Cardiovascular: No pedal edema Respiratory: No cyanosis, no use of accessory musculature GI: abdomen soft Skin: No lesions in the area of chief complaint Neurologic: Sensation intact distally Psychiatric: Patient is competent for consent with normal mood and affect Lymphatic: no lymphedema  MUSCULOSKELETAL: exam stable  Assessment: Right shoulder rotator cuff tear  Plan: Plan for Procedure(s): RIGHT SHOULDER ARTHROSCOPY WITH ROTATOR CUFF REPAIR AND SUBACROMIAL DECOMPRESSION  The risks benefits and alternatives were discussed with the patient including but not limited to the risks of nonoperative treatment, versus surgical intervention including infection, bleeding, nerve injury,  blood clots, cardiopulmonary complications, morbidity, mortality, among others, and they were willing to proceed.   Marianna Payment, MD   02/20/2014 8:44 PM

## 2014-02-21 ENCOUNTER — Ambulatory Visit (HOSPITAL_COMMUNITY)
Admission: RE | Admit: 2014-02-21 | Discharge: 2014-02-21 | Disposition: A | Discharge status: Home / Self Care | Payer: BC Managed Care – PPO | Source: Ambulatory Visit | Attending: Orthopaedic Surgery | Admitting: Orthopaedic Surgery

## 2014-02-21 ENCOUNTER — Ambulatory Visit (HOSPITAL_COMMUNITY): Payer: BC Managed Care – PPO | Admitting: Vascular Surgery

## 2014-02-21 ENCOUNTER — Ambulatory Visit (HOSPITAL_COMMUNITY): Payer: BC Managed Care – PPO | Admitting: Anesthesiology

## 2014-02-21 ENCOUNTER — Encounter (HOSPITAL_COMMUNITY)
Admission: RE | Disposition: A | Discharge status: Home / Self Care | Payer: Self-pay | Source: Ambulatory Visit | Attending: Orthopaedic Surgery

## 2014-02-21 ENCOUNTER — Encounter (HOSPITAL_COMMUNITY): Payer: Self-pay | Admitting: *Deleted

## 2014-02-21 DIAGNOSIS — I509 Heart failure, unspecified: Secondary | ICD-10-CM | POA: Insufficient documentation

## 2014-02-21 DIAGNOSIS — M1990 Unspecified osteoarthritis, unspecified site: Secondary | ICD-10-CM | POA: Insufficient documentation

## 2014-02-21 DIAGNOSIS — Z6835 Body mass index (BMI) 35.0-35.9, adult: Secondary | ICD-10-CM | POA: Insufficient documentation

## 2014-02-21 DIAGNOSIS — I252 Old myocardial infarction: Secondary | ICD-10-CM | POA: Insufficient documentation

## 2014-02-21 DIAGNOSIS — M75101 Unspecified rotator cuff tear or rupture of right shoulder, not specified as traumatic: Secondary | ICD-10-CM | POA: Insufficient documentation

## 2014-02-21 DIAGNOSIS — G4730 Sleep apnea, unspecified: Secondary | ICD-10-CM | POA: Insufficient documentation

## 2014-02-21 DIAGNOSIS — I10 Essential (primary) hypertension: Secondary | ICD-10-CM | POA: Insufficient documentation

## 2014-02-21 DIAGNOSIS — Z79899 Other long term (current) drug therapy: Secondary | ICD-10-CM | POA: Insufficient documentation

## 2014-02-21 DIAGNOSIS — E785 Hyperlipidemia, unspecified: Secondary | ICD-10-CM | POA: Insufficient documentation

## 2014-02-21 DIAGNOSIS — E669 Obesity, unspecified: Secondary | ICD-10-CM | POA: Insufficient documentation

## 2014-02-21 DIAGNOSIS — E119 Type 2 diabetes mellitus without complications: Secondary | ICD-10-CM | POA: Insufficient documentation

## 2014-02-21 DIAGNOSIS — Z7982 Long term (current) use of aspirin: Secondary | ICD-10-CM | POA: Insufficient documentation

## 2014-02-21 DIAGNOSIS — M7550 Bursitis of unspecified shoulder: Secondary | ICD-10-CM | POA: Insufficient documentation

## 2014-02-21 HISTORY — PX: SHOULDER ARTHROSCOPY WITH ROTATOR CUFF REPAIR AND SUBACROMIAL DECOMPRESSION: SHX5686

## 2014-02-21 LAB — GLUCOSE, CAPILLARY
GLUCOSE-CAPILLARY: 209 mg/dL — AB (ref 70–99)
Glucose-Capillary: 181 mg/dL — ABNORMAL HIGH (ref 70–99)
Glucose-Capillary: 166 mg/dL — ABNORMAL HIGH (ref 70–99)

## 2014-02-21 SURGERY — SHOULDER ARTHROSCOPY WITH ROTATOR CUFF REPAIR AND SUBACROMIAL DECOMPRESSION
Anesthesia: Regional | Site: Shoulder | Laterality: Right

## 2014-02-21 MED ORDER — OXYCODONE HCL ER 10 MG PO T12A: 10.0000 mg | EXTENDED_RELEASE_TABLET | Freq: Two times a day (BID) | ORAL | Status: AC

## 2014-02-21 MED ORDER — PROPOFOL 10 MG/ML IV BOLUS
INTRAVENOUS | Status: DC | PRN
  Administered 2014-02-21: 160 mg via INTRAVENOUS

## 2014-02-21 MED ORDER — LACTATED RINGERS IV SOLN
INTRAVENOUS | Status: DC
  Administered 2014-02-21: 09:00:00 via INTRAVENOUS

## 2014-02-21 MED ORDER — SODIUM CHLORIDE 0.9 % IR SOLN
Status: DC | PRN
  Administered 2014-02-21 (×2): 3000 mL
  Administered 2014-02-21 (×3): 1000 mL

## 2014-02-21 MED ORDER — ACETAMINOPHEN 325 MG PO TABS: 325.0000 mg | ORAL_TABLET | ORAL | Status: DC | PRN

## 2014-02-21 MED ORDER — LIDOCAINE HCL (CARDIAC) 20 MG/ML IV SOLN
INTRAVENOUS | Status: AC
  Filled 2014-02-21: qty 5

## 2014-02-21 MED ORDER — PROPOFOL 10 MG/ML IV BOLUS
INTRAVENOUS | Status: AC
  Filled 2014-02-21: qty 20

## 2014-02-21 MED ORDER — GLYCOPYRROLATE 0.2 MG/ML IJ SOLN
INTRAMUSCULAR | Status: AC
  Filled 2014-02-21: qty 3

## 2014-02-21 MED ORDER — GLYCOPYRROLATE 0.2 MG/ML IJ SOLN
INTRAMUSCULAR | Status: DC | PRN
  Administered 2014-02-21: 0.6 mg via INTRAVENOUS

## 2014-02-21 MED ORDER — OXYCODONE HCL 5 MG PO TABS
5.0000 mg | ORAL_TABLET | ORAL | Status: AC | PRN
Start: 2014-02-21 — End: ?

## 2014-02-21 MED ORDER — LIDOCAINE HCL (CARDIAC) 20 MG/ML IV SOLN
INTRAVENOUS | Status: DC | PRN
  Administered 2014-02-21: 80 mg via INTRAVENOUS

## 2014-02-21 MED ORDER — SENNOSIDES-DOCUSATE SODIUM 8.6-50 MG PO TABS: 1.0000 | ORAL_TABLET | Freq: Every evening | ORAL | Status: AC | PRN

## 2014-02-21 MED ORDER — NEOSTIGMINE METHYLSULFATE 10 MG/10ML IV SOLN
INTRAVENOUS | Status: DC | PRN
  Administered 2014-02-21: 4 mg via INTRAVENOUS

## 2014-02-21 MED ORDER — ONDANSETRON HCL 4 MG/2ML IJ SOLN
INTRAMUSCULAR | Status: AC
  Filled 2014-02-21: qty 2

## 2014-02-21 MED ORDER — ARTIFICIAL TEARS OP OINT
TOPICAL_OINTMENT | OPHTHALMIC | Status: DC | PRN
  Administered 2014-02-21: 1 via OPHTHALMIC

## 2014-02-21 MED ORDER — METHOCARBAMOL 750 MG PO TABS: 750.0000 mg | ORAL_TABLET | Freq: Two times a day (BID) | ORAL | Status: AC | PRN

## 2014-02-21 MED ORDER — ROPIVACAINE HCL 5 MG/ML IJ SOLN
INTRAMUSCULAR | Status: DC | PRN
  Administered 2014-02-21: 20 mL via PERINEURAL

## 2014-02-21 MED ORDER — HYDROMORPHONE HCL 1 MG/ML IJ SOLN: 0.2500 mg | INTRAMUSCULAR | Status: DC | PRN

## 2014-02-21 MED ORDER — ONDANSETRON HCL 4 MG/2ML IJ SOLN
INTRAMUSCULAR | Status: DC | PRN
  Administered 2014-02-21: 4 mg via INTRAVENOUS

## 2014-02-21 MED ORDER — MIDAZOLAM HCL 2 MG/2ML IJ SOLN
INTRAMUSCULAR | Status: AC
  Filled 2014-02-21: qty 2

## 2014-02-21 MED ORDER — SODIUM CHLORIDE 0.9 % IJ SOLN
INTRAMUSCULAR | Status: AC
  Filled 2014-02-21: qty 10

## 2014-02-21 MED ORDER — BUPIVACAINE-EPINEPHRINE 0.25% -1:200000 IJ SOLN
INTRAMUSCULAR | Status: DC | PRN
  Administered 2014-02-21: 30 mL

## 2014-02-21 MED ORDER — LACTATED RINGERS IV SOLN
INTRAVENOUS | Status: DC | PRN
  Administered 2014-02-21 (×2): via INTRAVENOUS

## 2014-02-21 MED ORDER — NEOSTIGMINE METHYLSULFATE 10 MG/10ML IV SOLN
INTRAVENOUS | Status: AC
  Filled 2014-02-21: qty 1

## 2014-02-21 MED ORDER — OXYCODONE HCL 5 MG/5ML PO SOLN: 5.0000 mg | Freq: Once | ORAL | Status: DC | PRN

## 2014-02-21 MED ORDER — ACETAMINOPHEN 160 MG/5ML PO SOLN
325.0000 mg | ORAL | Status: DC | PRN
  Filled 2014-02-21: qty 20.3

## 2014-02-21 MED ORDER — HYDRALAZINE HCL 20 MG/ML IJ SOLN
INTRAMUSCULAR | Status: AC
  Filled 2014-02-21: qty 1

## 2014-02-21 MED ORDER — ARTIFICIAL TEARS OP OINT
TOPICAL_OINTMENT | OPHTHALMIC | Status: AC
  Filled 2014-02-21: qty 7

## 2014-02-21 MED ORDER — HYDRALAZINE HCL 20 MG/ML IJ SOLN
INTRAMUSCULAR | Status: DC | PRN
  Administered 2014-02-21: 10 mg via INTRAVENOUS
  Administered 2014-02-21: 5 mg via INTRAVENOUS

## 2014-02-21 MED ORDER — DEXAMETHASONE SODIUM PHOSPHATE 4 MG/ML IJ SOLN
INTRAMUSCULAR | Status: AC
  Administered 2014-02-21: 4 mg via PERINEURAL
  Filled 2014-02-21: qty 1

## 2014-02-21 MED ORDER — FENTANYL CITRATE 0.05 MG/ML IJ SOLN
INTRAMUSCULAR | Status: AC
  Filled 2014-02-21: qty 5

## 2014-02-21 MED ORDER — BUPIVACAINE-EPINEPHRINE (PF) 0.25% -1:200000 IJ SOLN
INTRAMUSCULAR | Status: AC
  Filled 2014-02-21: qty 30

## 2014-02-21 MED ORDER — ROCURONIUM BROMIDE 100 MG/10ML IV SOLN
INTRAVENOUS | Status: DC | PRN
  Administered 2014-02-21: 50 mg via INTRAVENOUS

## 2014-02-21 MED ORDER — ROCURONIUM BROMIDE 50 MG/5ML IV SOLN
INTRAVENOUS | Status: AC
  Filled 2014-02-21: qty 1

## 2014-02-21 MED ORDER — FENTANYL CITRATE 0.05 MG/ML IJ SOLN
50.0000 ug | INTRAMUSCULAR | Status: DC | PRN
  Administered 2014-02-21: 100 ug via INTRAVENOUS
  Filled 2014-02-21: qty 2

## 2014-02-21 MED ORDER — OXYCODONE HCL 5 MG PO TABS: 5.0000 mg | ORAL_TABLET | Freq: Once | ORAL | Status: DC | PRN

## 2014-02-21 MED ORDER — FENTANYL CITRATE 0.05 MG/ML IJ SOLN
INTRAMUSCULAR | Status: DC | PRN
  Administered 2014-02-21 (×2): 100 ug via INTRAVENOUS
  Administered 2014-02-21: 50 ug via INTRAVENOUS

## 2014-02-21 MED ORDER — CEFAZOLIN SODIUM-DEXTROSE 2-3 GM-% IV SOLR
INTRAVENOUS | Status: DC | PRN
  Administered 2014-02-21: 2 g via INTRAVENOUS

## 2014-02-21 MED ORDER — MIDAZOLAM HCL 2 MG/2ML IJ SOLN
1.0000 mg | INTRAMUSCULAR | Status: DC | PRN
  Administered 2014-02-21: 2 mg via INTRAVENOUS
  Filled 2014-02-21: qty 2

## 2014-02-21 SURGICAL SUPPLY — 46 items
BLADE GREAT WHITE 4.2 (BLADE) ×2 IMPLANT
BLADE GREAT WHITE 4.2MM (BLADE) ×1
BLADE SURG 11 STRL SS (BLADE) ×3 IMPLANT
BOOTCOVER CLEANROOM LRG (PROTECTIVE WEAR) ×6 IMPLANT
BUR OVAL 4.0 (BURR) ×3 IMPLANT
CANNULA 5.75X7 CRYSTAL CLEAR (CANNULA) ×3 IMPLANT
CANNULA TWIST IN 8.25X7CM (CANNULA) ×3 IMPLANT
DRAPE INCISE IOBAN 66X45 STRL (DRAPES) ×3 IMPLANT
DRAPE STERI 35X30 U-POUCH (DRAPES) ×3 IMPLANT
DRAPE SURG 17X11 SM STRL (DRAPES) ×3 IMPLANT
DRAPE U-SHAPE 47X51 STRL (DRAPES) ×3 IMPLANT
DRSG PAD ABDOMINAL 8X10 ST (GAUZE/BANDAGES/DRESSINGS) ×4 IMPLANT
DURAPREP 26ML APPLICATOR (WOUND CARE) ×6 IMPLANT
GAUZE SPONGE 4X4 12PLY STRL (GAUZE/BANDAGES/DRESSINGS) ×3 IMPLANT
GAUZE XEROFORM 1X8 LF (GAUZE/BANDAGES/DRESSINGS) ×2 IMPLANT
GLOVE BIOGEL PI IND STRL 7.0 (GLOVE) IMPLANT
GLOVE BIOGEL PI INDICATOR 7.0 (GLOVE) ×2
GLOVE ECLIPSE 6.5 STRL STRAW (GLOVE) ×2 IMPLANT
GLOVE NEODERM STRL 7.5 LF PF (GLOVE) ×2 IMPLANT
GLOVE SURG NEODERM 7.5  LF PF (GLOVE) ×4
GOWN STRL REIN XL XLG (GOWN DISPOSABLE) ×3 IMPLANT
KIT BASIN OR (CUSTOM PROCEDURE TRAY) ×3 IMPLANT
KIT ROOM TURNOVER OR (KITS) ×3 IMPLANT
KIT SHOULDER TRACTION (DRAPES) ×3 IMPLANT
MANIFOLD NEPTUNE II (INSTRUMENTS) ×3 IMPLANT
NDL SCORPION MULTI FIRE (NEEDLE) ×1 IMPLANT
NDL SPNL 18GX3.5 QUINCKE PK (NEEDLE) ×1 IMPLANT
NEEDLE SCORPION MULTI FIRE (NEEDLE) ×3 IMPLANT
NEEDLE SPNL 18GX3.5 QUINCKE PK (NEEDLE) ×3 IMPLANT
NS IRRIG 1000ML POUR BTL (IV SOLUTION) ×1 IMPLANT
PACK SHOULDER (CUSTOM PROCEDURE TRAY) ×3 IMPLANT
PAD ARMBOARD 7.5X6 YLW CONV (MISCELLANEOUS) ×6 IMPLANT
SET ARTHROSCOPY TUBING (MISCELLANEOUS) ×3
SET ARTHROSCOPY TUBING LN (MISCELLANEOUS) ×1 IMPLANT
SLING ARM LRG ADULT FOAM STRAP (SOFTGOODS) ×2 IMPLANT
SPONGE GAUZE 4X4 12PLY STER LF (GAUZE/BANDAGES/DRESSINGS) ×2 IMPLANT
SPONGE LAP 4X18 X RAY DECT (DISPOSABLE) ×3 IMPLANT
SUT ETHILON 3 0 PS 1 (SUTURE) ×3 IMPLANT
SUT TIGER TAPE 7 IN WHITE (SUTURE) IMPLANT
TAPE CLOTH SURG 4X10 WHT LF (GAUZE/BANDAGES/DRESSINGS) ×2 IMPLANT
TAPE FIBER 2MM 7IN #2 BLUE (SUTURE) ×1 IMPLANT
TAPE PAPER 3X10 WHT MICROPORE (GAUZE/BANDAGES/DRESSINGS) ×3 IMPLANT
TOWEL OR 17X24 6PK STRL BLUE (TOWEL DISPOSABLE) ×3 IMPLANT
TOWEL OR 17X26 10 PK STRL BLUE (TOWEL DISPOSABLE) ×3 IMPLANT
WAND HAND CNTRL MULTIVAC 90 (MISCELLANEOUS) ×3 IMPLANT
WATER STERILE IRR 1000ML POUR (IV SOLUTION) ×3 IMPLANT

## 2014-02-21 NOTE — Anesthesia Procedure Notes (Addendum)
Anesthesia Regional Block:  Interscalene brachial plexus block  Pre-Anesthetic Checklist: ,, timeout performed, Correct Patient, Correct Site, Correct Laterality, Correct Procedure, Correct Position, site marked, Risks and benefits discussed,  Surgical consent,  Pre-op evaluation,  At surgeon's request and post-op pain management  Laterality: Upper and Right  Prep: chloraprep       Needles:  Injection technique: Single-shot  Needle Type: Echogenic Stimulator Needle          Additional Needles:  Procedures: ultrasound guided (picture in chart) and nerve stimulator Interscalene brachial plexus block  Nerve Stimulator or Paresthesia:  Response: deltoid, 0.5 mA,   Additional Responses:   Narrative:  Injection made incrementally with aspirations every 5 mL.  Performed by: Personally  Anesthesiologist: Layman Gully, CHRIS  Additional Notes: H+P and labs reviewed, risks and benefits discussed with patient, procedure tolerated well without complications   Procedure Name: Intubation Date/Time: 02/21/2014 12:06 PM Performed by: Ebbie Latus E Pre-anesthesia Checklist: Patient identified, Emergency Drugs available, Suction available, Patient being monitored and Timeout performed Patient Re-evaluated:Patient Re-evaluated prior to inductionOxygen Delivery Method: Circle system utilized Preoxygenation: Pre-oxygenation with 100% oxygen Intubation Type: IV induction Ventilation: Mask ventilation without difficulty Laryngoscope Size: Mac and 3 Grade View: Grade I Tube type: Oral Tube size: 8.0 mm Number of attempts: 1 Airway Equipment and Method: Stylet Placement Confirmation: ETT inserted through vocal cords under direct vision,  positive ETCO2,  CO2 detector and breath sounds checked- equal and bilateral Secured at: 23 cm Tube secured with: Tape Dental Injury: Teeth and Oropharynx as per pre-operative assessment

## 2014-02-21 NOTE — Anesthesia Preprocedure Evaluation (Signed)
Anesthesia Evaluation  Patient identified by MRN, date of birth, ID band Patient awake    Reviewed: Allergy & Precautions, H&P , NPO status , Patient's Chart, lab work & pertinent test results  History of Anesthesia Complications Negative for: history of anesthetic complications  Airway Mallampati: III  TM Distance: >3 FB Neck ROM: Full    Dental  (+) Teeth Intact   Pulmonary neg shortness of breath, sleep apnea and Continuous Positive Airway Pressure Ventilation , neg COPDneg recent URI,  breath sounds clear to auscultation        Cardiovascular hypertension, Pt. on medications - angina- Past MI and - CHF - dysrhythmias - Valvular Problems/MurmursRhythm:Regular     Neuro/Psych negative neurological ROS  negative psych ROS   GI/Hepatic negative GI ROS, Neg liver ROS,   Endo/Other  diabetes, Type 2, Oral Hypoglycemic AgentsMorbid obesity  Renal/GU negative Renal ROS     Musculoskeletal  (+) Arthritis -,   Abdominal   Peds  Hematology negative hematology ROS (+)   Anesthesia Other Findings   Reproductive/Obstetrics                             Anesthesia Physical Anesthesia Plan  ASA: III  Anesthesia Plan: General   Post-op Pain Management:    Induction: Intravenous  Airway Management Planned: Oral ETT  Additional Equipment: None  Intra-op Plan:   Post-operative Plan: Extubation in OR  Informed Consent: I have reviewed the patients History and Physical, chart, labs and discussed the procedure including the risks, benefits and alternatives for the proposed anesthesia with the patient or authorized representative who has indicated his/her understanding and acceptance.   Dental advisory given  Plan Discussed with: CRNA and Surgeon  Anesthesia Plan Comments:         Anesthesia Quick Evaluation

## 2014-02-21 NOTE — Discharge Instructions (Signed)

## 2014-02-21 NOTE — Transfer of Care (Signed)
Immediate Anesthesia Transfer of Care Note  Patient: Zachary Dunn  Procedure(s) Performed: Procedure(s): RIGHT SHOULDER ARTHROSCOPYSUBACROMIAL DECOMPRESSION WITH EXTENSIVE DEBRIDEMENT (Right)  Patient Location: PACU  Anesthesia Type:General and Regional  Level of Consciousness: awake, alert , oriented and sedated  Airway & Oxygen Therapy: Patient Spontanous Breathing and Patient connected to face mask oxygen  Post-op Assessment: Report given to PACU RN, Post -op Vital signs reviewed and stable and Patient moving all extremities  Post vital signs: Reviewed and stable  Complications: No apparent anesthesia complications

## 2014-02-21 NOTE — Op Note (Signed)
Date of surgery: 02/21/2014  Preoperative diagnosis: Possible rotator cuff tear  Postoperative diagnosis: 1. Intact rotator cuff 2. Extensive subacromial bursitis 3. Degenerative SLAP and labral tears   Operative findings: 1. Intact rotator cuff 2. Extensive subacromial bursitis 3. Degenerative SLAP and labral tear  Procedure: 1. Arthroscopic right shoulder extensive debridement 2. Arthroscopic right shoulder subacromial decompression with acromioplasty   Surgeon: Eduard Roux, M.D.  Anesthesia: Gen. and regional  Estimated blood loss: Minimal  Complications: None  Condition to PACU: Stable   Indications for procedure: The patient is a 63 year old gentleman who presents for surgical treatment of right shoulder pain after failing extensive conservative treatment. The risks, benefits, and alternative surgery were discussed with the patient he understood and wished to proceed.  Description of procedure: The patient was identified in the preoperative holding area. The operative site was marked by the surgeon and confirmed with the patient. He is brought back to the operating room. His placed supine on table. General anesthesia was induced. He was placed in a left lateral decubitus position and a beanbag. All bony prominences were well-padded and SCDs were placed on the lower extremities. The right upper extremity was suspended and a fishing pole mechanism. It was prepped and draped in standard sterile fashion. Timeout was performed preoperative antibiotic given. The standard posterior and anterior arthroscopic portals to the shoulder were established. A diagnostic shoulder arthroscopy was performed. We are able to see a degenerative SLAP tear that extended anteriorly to the 3:00 position. He also had mild amount of degenerative tearing of the biceps anchor. This was debrided using a oscillating shaver. The SLAP and the labral tears were also debrided using oscillating shaver. There was no  signs of arthritis of the glenohumeral joint. The articular side of the rotator cuff appeared to be intact. We then moved into the subacromial space. An additional lateral portal was established under arthroscopic visualization. There was an extensive amount of subacromial bursitis and bursal tissue. This was debrided and excised using the oscillating shaver. The anterolateral corner of the acromion and the undersurface of the acromion was established using an [employer] wand. Of note there was a small amount of space between the rotator cuff and the acromion the acromion appeared to be a type III with a hook. After extensive debridement of the bursal tissue the rotator cuff was stressed and probed.  We inspected the rotator cuff from the anterior all the way to the posterior margins and we could not find any tears. We then performed an acromioplasty with a high-speed bur. Final pictures were taken. The wounds were closed with interrupted nylon suture. Sterile dressing was were applied. Patient was placed in a sling and was extubated and transferred to the PACU in stable condition.   Postoperative plan: Patient will be weight bear as tolerated to the right upper extremity. He will follow-up in one week to initiate physical therapy.

## 2014-02-22 ENCOUNTER — Encounter (HOSPITAL_COMMUNITY): Payer: Self-pay | Admitting: Orthopaedic Surgery

## 2014-02-22 NOTE — Anesthesia Postprocedure Evaluation (Signed)
  Anesthesia Post-op Note  Patient: Zachary Dunn  Procedure(s) Performed: Procedure(s): RIGHT SHOULDER ARTHROSCOPYSUBACROMIAL DECOMPRESSION WITH EXTENSIVE DEBRIDEMENT (Right)  Patient Location: PACU  Anesthesia Type:General and Regional  Level of Consciousness: awake  Airway and Oxygen Therapy: Patient Spontanous Breathing  Post-op Pain: none  Post-op Assessment: Post-op Vital signs reviewed, Patient's Cardiovascular Status Stable, Respiratory Function Stable, Patent Airway, No signs of Nausea or vomiting and Pain level controlled  Post-op Vital Signs: Reviewed and stable  Last Vitals:  Filed Vitals:   02/21/14 1525  BP:   Pulse: 100  Temp: 36.6 C  Resp: 26    Complications: No apparent anesthesia complications

## 2014-03-06 ENCOUNTER — Ambulatory Visit: Payer: BLUE CROSS/BLUE SHIELD | Attending: Orthopaedic Surgery

## 2014-03-06 DIAGNOSIS — M25611 Stiffness of right shoulder, not elsewhere classified: Secondary | ICD-10-CM | POA: Insufficient documentation

## 2014-03-06 DIAGNOSIS — Z4789 Encounter for other orthopedic aftercare: Secondary | ICD-10-CM | POA: Insufficient documentation

## 2014-03-06 DIAGNOSIS — M25511 Pain in right shoulder: Secondary | ICD-10-CM | POA: Insufficient documentation

## 2014-03-09 ENCOUNTER — Ambulatory Visit: Payer: BLUE CROSS/BLUE SHIELD | Admitting: Physical Therapy

## 2014-03-09 DIAGNOSIS — Z4789 Encounter for other orthopedic aftercare: Secondary | ICD-10-CM | POA: Diagnosis not present

## 2014-03-15 ENCOUNTER — Ambulatory Visit: Payer: BLUE CROSS/BLUE SHIELD | Admitting: Physical Therapy

## 2014-03-16 ENCOUNTER — Ambulatory Visit: Payer: BLUE CROSS/BLUE SHIELD | Admitting: Physical Therapy

## 2014-03-16 DIAGNOSIS — Z4789 Encounter for other orthopedic aftercare: Secondary | ICD-10-CM | POA: Diagnosis not present

## 2014-03-26 ENCOUNTER — Ambulatory Visit: Payer: BLUE CROSS/BLUE SHIELD

## 2014-03-26 DIAGNOSIS — Z4789 Encounter for other orthopedic aftercare: Secondary | ICD-10-CM | POA: Diagnosis not present

## 2014-03-28 ENCOUNTER — Ambulatory Visit: Payer: BLUE CROSS/BLUE SHIELD | Admitting: Physical Therapy

## 2014-03-28 DIAGNOSIS — Z4789 Encounter for other orthopedic aftercare: Secondary | ICD-10-CM | POA: Diagnosis not present

## 2014-04-02 ENCOUNTER — Ambulatory Visit: Payer: BLUE CROSS/BLUE SHIELD | Attending: Orthopaedic Surgery

## 2014-04-02 DIAGNOSIS — M25611 Stiffness of right shoulder, not elsewhere classified: Secondary | ICD-10-CM | POA: Insufficient documentation

## 2014-04-02 DIAGNOSIS — M25511 Pain in right shoulder: Secondary | ICD-10-CM | POA: Insufficient documentation

## 2014-04-02 DIAGNOSIS — Z4789 Encounter for other orthopedic aftercare: Secondary | ICD-10-CM | POA: Insufficient documentation

## 2014-04-04 ENCOUNTER — Ambulatory Visit: Payer: BLUE CROSS/BLUE SHIELD | Admitting: Physical Therapy

## 2014-04-04 DIAGNOSIS — Z4789 Encounter for other orthopedic aftercare: Secondary | ICD-10-CM | POA: Diagnosis not present

## 2014-04-10 ENCOUNTER — Ambulatory Visit: Payer: BLUE CROSS/BLUE SHIELD

## 2014-04-10 DIAGNOSIS — Z4789 Encounter for other orthopedic aftercare: Secondary | ICD-10-CM | POA: Diagnosis not present

## 2014-04-12 ENCOUNTER — Ambulatory Visit: Payer: BLUE CROSS/BLUE SHIELD | Admitting: Physical Therapy

## 2014-04-12 DIAGNOSIS — Z4789 Encounter for other orthopedic aftercare: Secondary | ICD-10-CM | POA: Diagnosis not present

## 2014-04-16 ENCOUNTER — Ambulatory Visit: Payer: BLUE CROSS/BLUE SHIELD | Admitting: Physical Therapy

## 2014-04-18 ENCOUNTER — Ambulatory Visit: Payer: BLUE CROSS/BLUE SHIELD

## 2014-04-18 DIAGNOSIS — R29898 Other symptoms and signs involving the musculoskeletal system: Secondary | ICD-10-CM

## 2014-04-18 DIAGNOSIS — M25611 Stiffness of right shoulder, not elsewhere classified: Secondary | ICD-10-CM

## 2014-04-18 DIAGNOSIS — Z4789 Encounter for other orthopedic aftercare: Secondary | ICD-10-CM | POA: Diagnosis not present

## 2014-04-18 DIAGNOSIS — M25511 Pain in right shoulder: Secondary | ICD-10-CM

## 2014-04-18 NOTE — Therapy (Signed)
[facility] [address] [place], [place], [place], [address] Phone: [phone]   Fax:  [phone]  Physical Therapy Treatment  Patient Details  Name: Zachary Dunn MRN: [id number] Date of Birth: [birth year] Referring Provider:  Chesley Noon, MD  Encounter Date: 04/18/2014      PT End of Session - 04/18/14 1053    Visit Number 9   Date for PT Re-Evaluation 06/01/14   PT Start Time 1014   PT Stop Time 1109   PT Time Calculation (min) 55 min   Activity Tolerance Patient tolerated treatment well   Behavior During Therapy [facility] for tasks assessed/performed      Past Medical History  Diagnosis Date  . Hypertension   . Colon polyp   . Diabetes mellitus     Type 2  . Hyperlipemia   . Sleep apnea     wears CPAP mask nightly     Past Surgical History  Procedure Laterality Date  . Colonoscopy    . Shoulder arthroscopy with rotator cuff repair and subacromial decompression Right 02/21/2014    Procedure: RIGHT SHOULDER ARTHROSCOPYSUBACROMIAL DECOMPRESSION WITH EXTENSIVE DEBRIDEMENT;  Surgeon: Marianna Payment, MD;  Location: [facility];  Service: Orthopedics;  Laterality: Right;    There were no vitals taken for this visit.  Visit Diagnosis:  Pain in joint, shoulder region, right - Plan: PT plan of care cert/re-cert  Shoulder joint stiffness, right - Plan: PT plan of care cert/re-cert  Weakness of shoulder - Plan: PT plan of care cert/re-cert      Subjective Assessment - 04/18/14 1024    Currently in Pain? Yes   Pain Score 5    Pain Location Shoulder   Pain Orientation Right   Pain Descriptors / Indicators Sharp   Pain Radiating Towards Rt anterior glenohumeral joint and triceps   Pain Onset More than a month ago   Aggravating Factors  reaching out to the side into abduction and IR motion   Pain Relieving Factors medication, rest, heat   Effect of Pain on Daily Activities pain with reaching, lifting and  reaching behind the back.  Not lifting heavy objects.  80% use of Rt UE reported.    Multiple Pain Sites No          [facility] PT Assessment - 04/18/14 0001    Assessment   Medical Diagnosis s/p Rt shoulder scope debridement   Precautions   Precautions None   Balance Screen   Has the patient fallen in the past 6 months No   Has the patient had a decrease in activity level because of a fear of falling?  No   Is the patient reluctant to leave their home because of a fear of falling?  No   Home Environment   Living Enviornment Private residence   Prior Function   Level of Independence Independent with basic ADLs   AROM   Overall AROM  Deficits   Right Shoulder Flexion 149 Degrees   Right Shoulder ABduction 130 Degrees   Right Shoulder Internal Rotation --  lacking 6 inchs vs the Lt.  Not able to get to midline   Right Shoulder External Rotation --  lacking 1 inch vs the Lt   Strength   Right Shoulder Flexion 4+/5   Right Shoulder ABduction 4-/5  pain with testing   Right Shoulder Internal Rotation 5/5   Right Shoulder External Rotation 4+/5  [facility] PT Treatment/Exercise - 04/18/14 0001    Exercises   Exercises Shoulder   Shoulder Exercises: Sidelying   ABduction AROM;20 reps   Shoulder Exercises: Pulleys   Flexion 3 minutes   ABduction 3 minutes   Shoulder Exercises: ROM/Strengthening   UBE (Upper Arm Bike) level 5 x 8 minutes   Wall Wash 1.5# added: 3x10 flexion and abduction  finger ladder   Modalities   Modalities Moist Heat   Moist Heat Therapy   Number Minutes Moist Heat 10 Minutes   Moist Heat Location Shoulder  Rt.   Manual Therapy   Manual Therapy Joint mobilization;Passive ROM   Joint Mobilization inferior and posterior glides of Rt glenohumeral joint   Passive ROM PROM into max Rt shoulder IR/ER                PT Education - 04/18/14 1053    Education provided Yes   Education Details handout provided for sidelying  abduction   Person(s) Educated Patient   Methods Explanation;Demonstration;Tactile cues;Handout   Comprehension Returned demonstration;Verbalized understanding          PT Short Term Goals - 04/18/14 1004    PT SHORT TERM GOAL #1   Title be independent with initial HEP for ROM and strength   Time 4   Period Weeks   Status Achieved   PT SHORT TERM GOAL #2   Title perform overhead activities with 50% greater ease   Baseline --  50-75% ease   Time 4   Period Weeks   Status Achieved   PT SHORT TERM GOAL #3   Title reach behind his back with 4/10 pain or less  8/10 reported   Time 4   Period Weeks   Status Not Met   PT SHORT TERM GOAL #4   Title demo improved Rt shoulder ER to 45 degrees or better to improve function           PT Long Term Goals - 04/18/14 1008    PT LONG TERM GOAL #1   Title demonstrate RICE understanding   Time 8   Period Weeks   Status Achieved   PT LONG TERM GOAL #2   Title be independent with advanced HEP for ROM and strength   Baseline --  Goal status: pt is independent in current HEP   Time 8   Period Weeks   Status Partially Met   PT LONG TERM GOAL #3   Title demo improved Rt shoulder flexion and abduction to 150 degrees to better normalize ADLs   Time 8   Period Weeks   PT LONG TERM GOAL #4   Title reach behind his back with Rt arm for ADLs with minimal discomfort  8/10 reported   Time 8   Period Weeks   Status Not Met   PT LONG TERM GOAL #5   Title demo Rt shoulder ER to [facility] to perform ADLs   Time 8   Period Weeks   Status Achieved   Additional Long Term Goals   Additional Long Term Goals Yes   PT LONG TERM GOAL #6   Title demo 5/5 Rt shoulder strength  see strength measures   Time 8   Period Weeks               Plan - 04/18/14 1059    Clinical Impression Statement Pt with improving strength and AROM.  Pt with pain and functional weakness that limits use of Rt UE.  Rehab Potential Good   PT Frequency 2x /  week   PT Duration 6 weeks   PT Treatment/Interventions Moist Heat;Passive range of motion;Therapeutic exercise;Ultrasound;Manual techniques;Neuromuscular re-education;Cryotherapy;Electrical Stimulation   PT Next Visit Plan See what MD says.  Continue to address ROM and strength deficits.  Treat pain PRN.   Consulted and Agree with Plan of Care Patient        Problem List Patient Active Problem List   Diagnosis Date Noted  . Personal history of colonic polyps-adenoma 06/20/2013    TAKACS,KELLY, PT 04/18/2014, 11:08 AM  [facility] [address] [place], [place], [place], [address] Phone: [phone]   Fax:  [phone]

## 2014-04-18 NOTE — Patient Instructions (Signed)
Abduction (Side-Lying)   Lie on left side. Raise arm above head. Keep palm forward. Repeat ___10_ times per set. Do __2__ sets per session. Do ___2_ sessions per day.  http://orth.exer.us/934   Copyright  VHI. All rights reserved.  Abduction (Side-Lying)   Lie on left side. Raise arm above head. Keep palm forward. Repeat ____ times per set. Do ____ sets per session. Do ____ sessions per day.  http://orth.exer.us/934   Copyright  VHI. All rights reserved.

## 2014-04-23 ENCOUNTER — Encounter: Payer: Self-pay | Admitting: Physical Therapy

## 2014-04-23 ENCOUNTER — Ambulatory Visit: Payer: BLUE CROSS/BLUE SHIELD | Admitting: Physical Therapy

## 2014-04-23 DIAGNOSIS — M25511 Pain in right shoulder: Secondary | ICD-10-CM

## 2014-04-23 DIAGNOSIS — Z4789 Encounter for other orthopedic aftercare: Secondary | ICD-10-CM | POA: Diagnosis not present

## 2014-04-23 DIAGNOSIS — R29898 Other symptoms and signs involving the musculoskeletal system: Secondary | ICD-10-CM

## 2014-04-23 DIAGNOSIS — M25611 Stiffness of right shoulder, not elsewhere classified: Secondary | ICD-10-CM

## 2014-04-23 NOTE — Therapy (Signed)
[facility] [address] [place], [place], [place], [address] Phone: [phone]   Fax:  [phone]  Physical Therapy Treatment  Patient Details  Name: Zachary Dunn MRN: [id number] Date of Birth: December 03, 1950 Referring Provider:  Chesley Noon, MD  Encounter Date: 04/23/2014      PT End of Session - 04/23/14 1118    Visit Number 10   Number of Visits 16   Date for PT Re-Evaluation 04/27/14   PT Start Time 1025   PT Stop Time 1125   PT Time Calculation (min) 60 min   Activity Tolerance Patient tolerated treatment well   Behavior During Therapy [facility] for tasks assessed/performed      Past Medical History  Diagnosis Date  . Hypertension   . Colon polyp   . Diabetes mellitus     Type 2  . Hyperlipemia   . Sleep apnea     wears CPAP mask nightly     Past Surgical History  Procedure Laterality Date  . Colonoscopy    . Shoulder arthroscopy with rotator cuff repair and subacromial decompression Right 02/21/2014    Procedure: RIGHT SHOULDER ARTHROSCOPYSUBACROMIAL DECOMPRESSION WITH EXTENSIVE DEBRIDEMENT;  Surgeon: Marianna Payment, MD;  Location: [place];  Service: Orthopedics;  Laterality: Right;    There were no vitals taken for this visit.  Visit Diagnosis:  Pain in joint, shoulder region, right  Shoulder joint stiffness, right  Weakness of shoulder      Subjective Assessment - 04/23/14 1032    Symptoms Pt. wakes up in the middle of the night with Rt shoulder pain   Limitations --  Abduction Rt is limited in ROM and strength   Currently in Pain? Yes   Pain Score 6   Pain in the night rated as 6-7, during the day a minor ache   Pain Location Shoulder   Pain Orientation Right   Pain Descriptors / Indicators Burning;Sharp   Pain Type Chronic pain   Pain Onset More than a month ago   Aggravating Factors  reaching out to the side into abduction and IR   Pain Relieving Factors resting position, heat, ice,  medication   Effect of Pain on Daily Activities pain with reaching, lifting and reaching behind the back.    Multiple Pain Sites No                    [facility] Adult PT Treatment/Exercise - 04/23/14 0001    Exercises   Exercises Shoulder   Shoulder Exercises: Sidelying   External Rotation AROM;20 reps;Right   ABduction AROM;Strengthening  1.5# added, needed tactile cues for porper body positioning   Shoulder Exercises: Standing   Other Standing Exercises chickenwings   3x10   Shoulder Exercises: Pulleys   Flexion 3 minutes   ABduction 3 minutes   Shoulder Exercises: ROM/Strengthening   UBE (Upper Arm Bike) level 5 x 8   Wall Wash 1.5# added: 3x10 flexion and abduction  finger ladder   Modalities   Modalities Moist Heat;Ultrasound   Moist Heat Therapy   Number Minutes Moist Heat 15 Minutes   Moist Heat Location Shoulder  Rt.   Ultrasound   Ultrasound Location Rt shoulder   Ultrasound Parameters 50%, 1MHz, 0.8Wcm,    Ultrasound Goals Pain   Manual Therapy   Manual Therapy Joint mobilization;Passive ROM   Joint Mobilization inferior & posterior glides of R glenohumeral joint, posterior capsule stretch   Passive ROM PROM into max Rt shoulder IR/ER  PT Short Term Goals - 04/18/14 1004    PT SHORT TERM GOAL #1   Title be independent with initial HEP for ROM and strength   Time 4   Period Weeks   Status Achieved   PT SHORT TERM GOAL #2   Title perform overhead activities with 50% greater ease   Baseline --  50-75% ease   Time 4   Period Weeks   Status Achieved   PT SHORT TERM GOAL #3   Title reach behind his back with 4/10 pain or less  8/10 reported   Time 4   Period Weeks   Status Not Met   PT SHORT TERM GOAL #4   Title demo improved Rt shoulder ER to 45 degrees or better to improve function           PT Long Term Goals - 04/18/14 1008    PT LONG TERM GOAL #1   Title demonstrate RICE understanding   Time 8   Period  Weeks   Status Achieved   PT LONG TERM GOAL #2   Title be independent with advanced HEP for ROM and strength   Baseline --  Goal status: pt is independent in current HEP   Time 8   Period Weeks   Status Partially Met   PT LONG TERM GOAL #3   Title demo improved Rt shoulder flexion and abduction to 150 degrees to better normalize ADLs   Time 8   Period Weeks   PT LONG TERM GOAL #4   Title reach behind his back with Rt arm for ADLs with minimal discomfort  8/10 reported   Time 8   Period Weeks   Status Not Met   PT LONG TERM GOAL #5   Title demo Rt shoulder ER to [facility] to perform ADLs   Time 8   Period Weeks   Status Achieved   Additional Long Term Goals   Additional Long Term Goals Yes   PT LONG TERM GOAL #6   Title demo 5/5 Rt shoulder strength  see strength measures   Time 8   Period Weeks               Plan - 04/23/14 1120    Clinical Impression Statement pt with diturbed sleep due to pain, and decrease functional weakness that limits use of R UE   Pt will benefit from skilled therapeutic intervention in order to improve on the following deficits Decreased range of motion;Improper body mechanics;Postural dysfunction;Decreased endurance;Decreased activity tolerance;Increased muscle spasms;Impaired UE functional use;Decreased strength;Decreased mobility   Rehab Potential Good   PT Frequency 2x / week   PT Duration 6 weeks   PT Treatment/Interventions Moist Heat;Passive range of motion;Therapeutic exercise;Ultrasound;Manual techniques;Neuromuscular re-education;Cryotherapy;Electrical Stimulation   PT Next Visit Plan Continue to improve ROM and strength.   Consulted and Agree with Plan of Care Patient        Problem List Patient Active Problem List   Diagnosis Date Noted  . Personal history of colonic polyps-adenoma 06/20/2013    NAUMANN-HOUEGNIFIO,Zalman Hull PTA 04/23/2014, 11:29 AM  [facility] [address]  [place], [place], [place], [address] Phone: [phone]   Fax:  [phone]

## 2014-04-26 ENCOUNTER — Ambulatory Visit: Payer: BLUE CROSS/BLUE SHIELD | Admitting: Physical Therapy

## 2014-04-26 DIAGNOSIS — Z4789 Encounter for other orthopedic aftercare: Secondary | ICD-10-CM | POA: Diagnosis not present

## 2014-04-26 DIAGNOSIS — M25511 Pain in right shoulder: Secondary | ICD-10-CM

## 2014-04-26 DIAGNOSIS — M25611 Stiffness of right shoulder, not elsewhere classified: Secondary | ICD-10-CM

## 2014-04-26 DIAGNOSIS — R29898 Other symptoms and signs involving the musculoskeletal system: Secondary | ICD-10-CM

## 2014-04-26 NOTE — Therapy (Signed)
[facility] [address], [address] [place], [place], [address] Phone: [phone]   Fax:  [phone]  Physical Therapy Treatment  Patient Details  Name: Zachary Dunn MRN: [id number] Date of Birth: 1950/11/09 Referring Provider:  Eartha Inch, MD  Encounter Date: 04/26/2014      PT End of Session - 04/26/14 1058    Visit Number 11   Date for PT Re-Evaluation 06/01/14      Past Medical History  Diagnosis Date  . Hypertension   . Colon polyp   . Diabetes mellitus     Type 2  . Hyperlipemia   . Sleep apnea     wears CPAP mask nightly     Past Surgical History  Procedure Laterality Date  . Colonoscopy    . Shoulder arthroscopy with rotator cuff repair and subacromial decompression Right 02/21/2014    Procedure: RIGHT SHOULDER ARTHROSCOPYSUBACROMIAL DECOMPRESSION WITH EXTENSIVE DEBRIDEMENT;  Surgeon: Cheral Almas, MD;  Location: [facility];  Service: Orthopedics;  Laterality: Right;    There were no vitals taken for this visit.  Visit Diagnosis:  No diagnosis found.      Subjective Assessment - 04/26/14 1014    Symptoms Today -The best it ever felt since surgery, notices weakness with horizontal reaching   Currently in Pain? No/denies   Pain Score --  pain in the night rated as 2-3/10   Pain Location Shoulder   Pain Orientation Right   Pain Descriptors / Indicators Burning;Sharp   Pain Type Chronic pain   Pain Radiating Towards Rt anterior glenohumeral joint   Pain Onset More than a month ago   Aggravating Factors  sleeping   Pain Relieving Factors chaning position, heat, ice, medication   Effect of Pain on Daily Activities pain with reaching, lifting and reaching behind the back   Multiple Pain Sites No                    [facility] Adult PT Treatment/Exercise - 04/26/14 0001    Shoulder Exercises: Sidelying   Flexion AROM  1 x 10   Flexion Weight (lbs) 1.5  2 x 10 reps   ABduction --   need tactile to avoid compensatory morvements   ABduction Weight (lbs) 1.5  3 x 10   Shoulder Exercises: ROM/Strengthening   UBE (Upper Arm Bike) level 5 x 8   Wall Wash 1.5# added: 3x10 flexion and abduction  finger ladder   Shoulder Exercises: Power [employer] 25 reps  35#   Moist Heat Therapy   Number Minutes Moist Heat 15 Minutes   Moist Heat Location Shoulder  Rt   Ultrasound   Ultrasound Location Rt ant. glenohumeral joint   Ultrasound Parameters 50%, , 0.8cm2   Ultrasound Goals Pain                  PT Short Term Goals - 04/18/14 1004    PT SHORT TERM GOAL #1   Title be independent with initial HEP for ROM and strength   Time 4   Period Weeks   Status Achieved   PT SHORT TERM GOAL #2   Title perform overhead activities with 50% greater ease   Baseline --  50-75% ease   Time 4   Period Weeks   Status Achieved   PT SHORT TERM GOAL #3   Title reach behind his back with 4/10 pain or less  8/10 reported   Time 4  Period Weeks   Status Not Met   PT SHORT TERM GOAL #4   Title demo improved Rt shoulder ER to 45 degrees or better to improve function           PT Long Term Goals - 04/18/14 1008    PT LONG TERM GOAL #1   Title demonstrate RICE understanding   Time 8   Period Weeks   Status Achieved   PT LONG TERM GOAL #2   Title be independent with advanced HEP for ROM and strength   Baseline --  Goal status: pt is independent in current HEP   Time 8   Period Weeks   Status Partially Met   PT LONG TERM GOAL #3   Title demo improved Rt shoulder flexion and abduction to 150 degrees to better normalize ADLs   Time 8   Period Weeks   PT LONG TERM GOAL #4   Title reach behind his back with Rt arm for ADLs with minimal discomfort  8/10 reported   Time 8   Period Weeks   Status Not Met   PT LONG TERM GOAL #5   Title demo Rt shoulder ER to [facility] to perform ADLs   Time 8   Period Weeks   Status Achieved   Additional Long Term Goals    Additional Long Term Goals Yes   PT LONG TERM GOAL #6   Title demo 5/5 Rt shoulder strength  see strength measures   Time 8   Period Weeks               Problem List Patient Active Problem List   Diagnosis Date Noted  . Personal history of colonic polyps-adenoma 06/20/2013    NAUMANN-HOUEGNIFIO,Kaylene Dawn PTA  04/26/2014, 6:03 PM  [facility] [address] [place], [place], [address] Phone: [phone]   Fax:  [phone]

## 2014-04-30 ENCOUNTER — Ambulatory Visit: Payer: BLUE CROSS/BLUE SHIELD | Admitting: Physical Therapy

## 2014-04-30 DIAGNOSIS — M25611 Stiffness of right shoulder, not elsewhere classified: Secondary | ICD-10-CM

## 2014-04-30 DIAGNOSIS — R29898 Other symptoms and signs involving the musculoskeletal system: Secondary | ICD-10-CM

## 2014-04-30 DIAGNOSIS — Z4789 Encounter for other orthopedic aftercare: Secondary | ICD-10-CM | POA: Diagnosis not present

## 2014-04-30 DIAGNOSIS — M25511 Pain in right shoulder: Secondary | ICD-10-CM

## 2014-04-30 NOTE — Therapy (Signed)
[facility] [address] [place], [place], [address] Phone: [phone]   Fax:  [phone]  Physical Therapy Treatment  Patient Details  Name: Zachary Dunn MRN: [id number] Date of Birth: 03-26-1950 Referring Provider:  Chesley Noon, MD  Encounter Date: 04/30/2014      PT End of Session - 04/30/14 1056    Visit Number 12   Number of Visits 16   Date for PT Re-Evaluation 06/01/14   PT Start Time 1013   PT Stop Time 1110   PT Time Calculation (min) 57 min   Activity Tolerance Patient tolerated treatment well   Behavior During Therapy [facility] for tasks assessed/performed      Past Medical History  Diagnosis Date  . Hypertension   . Colon polyp   . Diabetes mellitus     Type 2  . Hyperlipemia   . Sleep apnea     wears CPAP mask nightly     Past Surgical History  Procedure Laterality Date  . Colonoscopy    . Shoulder arthroscopy with rotator cuff repair and subacromial decompression Right 02/21/2014    Procedure: RIGHT SHOULDER ARTHROSCOPYSUBACROMIAL DECOMPRESSION WITH EXTENSIVE DEBRIDEMENT;  Surgeon: Marianna Payment, MD;  Location: [place];  Service: Orthopedics;  Laterality: Right;    There were no vitals taken for this visit.  Visit Diagnosis:  Pain in joint, shoulder region, right  Shoulder joint stiffness, right  Weakness of shoulder      Subjective Assessment - 04/30/14 1014    Symptoms Pt. was able to perform yardwork, but his ROM in Rt UE is limited   Limitations Other (comment)  movements envolvifng abduction   Currently in Pain? No/denies   Pain Score --  sometimes a little ache   Pain Location Shoulder   Pain Orientation Right   Pain Descriptors / Indicators Burning;Aching;Sharp   Pain Radiating Towards Rt anterior glenohumeral joint   Pain Onset More than a month ago   Aggravating Factors  sleeping is disturbed by sharp, burning pain   Pain Relieving Factors changing  position, heat, iche, medication   Effect of Pain on Daily Activities pain with reaching, lifting and reaching behind the back   Multiple Pain Sites No                    [facility] Adult PT Treatment/Exercise - 04/30/14 0001    Exercises   Exercises Shoulder   Shoulder Exercises: Standing   Other Standing Exercises chicken wings  bil. elevation, depression into abduction 3 x 10 in front of   Shoulder Exercises: ROM/Strengthening   UBE (Upper Arm Bike) level 5 x 8   Wall Wash 2# 3 x 10 flexion & abduction  on wall   Shoulder Exercises: Power [employer] 25 reps;5 reps  45#   Flexion 25 reps;5 reps  25# challenging for pt due to weakness   Modalities   Modalities Moist Heat;Electrical Stimulation   Moist Heat Therapy   Number Minutes Moist Heat 15 Minutes   Moist Heat Location Shoulder  Rt   Electrical Stimulation   Electrical Stimulation Location Rt shoulder   Electrical Stimulation Action IFC   Electrical Stimulation Parameters 80-15-Hz   Electrical Stimulation Goals Pain   Ultrasound   Ultrasound Location Rt ant   Ultrasound Parameters 50%   Ultrasound Goals --  soreness                  PT Short Term  Goals - 04/18/14 1004    PT SHORT TERM GOAL #1   Title be independent with initial HEP for ROM and strength   Time 4   Period Weeks   Status Achieved   PT SHORT TERM GOAL #2   Title perform overhead activities with 50% greater ease   Baseline --  50-75% ease   Time 4   Period Weeks   Status Achieved   PT SHORT TERM GOAL #3   Title reach behind his back with 4/10 pain or less  8/10 reported   Time 4   Period Weeks   Status Not Met   PT SHORT TERM GOAL #4   Title demo improved Rt shoulder ER to 45 degrees or better to improve function           PT Long Term Goals - 04/18/14 1008    PT LONG TERM GOAL #1   Title demonstrate RICE understanding   Time 8   Period Weeks   Status Achieved   PT LONG TERM GOAL #2   Title be independent  with advanced HEP for ROM and strength   Baseline --  Goal status: pt is independent in current HEP   Time 8   Period Weeks   Status Partially Met   PT LONG TERM GOAL #3   Title demo improved Rt shoulder flexion and abduction to 150 degrees to better normalize ADLs   Time 8   Period Weeks   PT LONG TERM GOAL #4   Title reach behind his back with Rt arm for ADLs with minimal discomfort  8/10 reported   Time 8   Period Weeks   Status Not Met   PT LONG TERM GOAL #5   Title demo Rt shoulder ER to [facility] to perform ADLs   Time 8   Period Weeks   Status Achieved   Additional Long Term Goals   Additional Long Term Goals Yes   PT LONG TERM GOAL #6   Title demo 5/5 Rt shoulder strength  see strength measures   Time 8   Period Weeks               Plan - 04/30/14 1057    Clinical Impression Statement pt with disturbed sleep due to pain, and weakness in Rt UE   Pt will benefit from skilled therapeutic intervention in order to improve on the following deficits Decreased range of motion;Improper body mechanics;Postural dysfunction;Decreased endurance;Decreased activity tolerance;Increased muscle spasms;Impaired UE functional use;Decreased strength;Decreased mobility   Rehab Potential Good   Clinical Impairments Affecting Rehab Potential None   PT Frequency 2x / week   PT Duration 6 weeks   PT Treatment/Interventions Moist Heat;Passive range of motion;Therapeutic exercise;Ultrasound;Manual techniques;Neuromuscular re-education;Cryotherapy;Electrical Stimulation   PT Next Visit Plan Continue to improve ROM and strength.   Consulted and Agree with Plan of Care Patient        Problem List Patient Active Problem List   Diagnosis Date Noted  . Personal history of colonic polyps-adenoma 06/20/2013    NAUMANN-HOUEGNIFIO,Emi Lymon PTA  04/30/2014, 11:00 AM  [facility] [address] [place], [place], [address] Phone:  [phone]   Fax:  [phone]

## 2014-05-02 ENCOUNTER — Ambulatory Visit: Payer: BLUE CROSS/BLUE SHIELD | Attending: Orthopaedic Surgery | Admitting: Physical Therapy

## 2014-05-02 ENCOUNTER — Encounter: Payer: Self-pay | Admitting: Physical Therapy

## 2014-05-02 DIAGNOSIS — Z4789 Encounter for other orthopedic aftercare: Secondary | ICD-10-CM | POA: Diagnosis not present

## 2014-05-02 DIAGNOSIS — R29898 Other symptoms and signs involving the musculoskeletal system: Secondary | ICD-10-CM

## 2014-05-02 DIAGNOSIS — M25511 Pain in right shoulder: Secondary | ICD-10-CM

## 2014-05-02 DIAGNOSIS — M25611 Stiffness of right shoulder, not elsewhere classified: Secondary | ICD-10-CM | POA: Diagnosis not present

## 2014-05-02 NOTE — Therapy (Signed)
[facility] [address], [place], [place], [address] Phone: [phone]   Fax:  [phone]  Physical Therapy Treatment  Patient Details  Name: Zachary Dunn MRN: [id number] Date of Birth: [birth year] Referring Provider:  Chesley Noon, MD  Encounter Date: 05/02/2014      PT End of Session - 05/02/14 0919    Visit Number 13   Number of Visits 16   Date for PT Re-Evaluation 06/01/14   PT Start Time 0830   PT Stop Time 0945   PT Time Calculation (min) 75 min   Activity Tolerance Patient tolerated treatment well   Behavior During Therapy [facility] for tasks assessed/performed      Past Medical History  Diagnosis Date  . Hypertension   . Colon polyp   . Diabetes mellitus     Type 2  . Hyperlipemia   . Sleep apnea     wears CPAP mask nightly     Past Surgical History  Procedure Laterality Date  . Colonoscopy    . Shoulder arthroscopy with rotator cuff repair and subacromial decompression Right 02/21/2014    Procedure: RIGHT SHOULDER ARTHROSCOPYSUBACROMIAL DECOMPRESSION WITH EXTENSIVE DEBRIDEMENT;  Surgeon: Marianna Payment, MD;  Location: [place];  Service: Orthopedics;  Laterality: Right;    There were no vitals taken for this visit.  Visit Diagnosis:  Shoulder joint stiffness, right  Pain in joint, shoulder region, right  Weakness of shoulder      Subjective Assessment - 05/02/14 0848    Symptoms Feeling good this morning. Active abduction still primary concern.   Currently in Pain? Yes   Pain Score 2    Pain Location Shoulder   Pain Orientation Right   Pain Descriptors / Indicators Aching   Aggravating Factors  Sleeping on it, lifting an object into adbuction.    Pain Relieving Factors Changing positions, heat at night, ice   Multiple Pain Sites No                    [facility] Adult PT Treatment/Exercise - 05/02/14 0001    Shoulder Exercises: Standing   Horizontal ABduction  Strengthening;Right;20 reps;Weights   Theraband Level (Shoulder Horizontal ABduction) --  2#, bent over at counter, vc for scapular position.   Flexion Strengthening;Right;20 reps;Weights  3# with verbal cuing on scapular position   Extension Strengthening;Right;20 reps;Weights   Theraband Level (Shoulder Extension) Other (comment)   Extension Weight (lbs) 2# wts   Extension Limitations Bent over at [employer] Strengthening;Right;20 reps;Weights  4#   Shoulder Exercises: Pulleys   Flexion 3 minutes   ABduction 3 minutes   ABduction Limitations Verbal cues to use ulnar side of hand to slide to clear clavicle better.   Shoulder Exercises: ROM/Strengthening   UBE (Upper Arm Bike) L6 4x4   Wall Wash 2# 3 x 10 flexion & abduction  on wall   Shoulder Exercises: Body Blade   Other Body Blade Exercises Held at mid range flexion 30 sec x2   Modalities   Modalities Moist Heat;Electrical Stimulation   Moist Heat Therapy   Number Minutes Moist Heat 15 Minutes   Moist Heat Location Shoulder  Rt   Electrical Stimulation   Electrical Stimulation Location Rt shoulder   Electrical Stimulation Action IFC   Electrical Stimulation Parameters 80-_0    Electrical Stimulation Goals Pain                  PT Short Term Goals -  05/02/14 0921    PT SHORT TERM GOAL #1   Title be independent with initial HEP for ROM and strength   Time 4   Period Weeks   Status Achieved   PT SHORT TERM GOAL #2   Title perform overhead activities with 50% greater ease   Time 4   Status Achieved   PT SHORT TERM GOAL #3   Title reach behind his back with 4/10 pain or less   Time 4   Period Weeks   Status On-going  rates 5-6/10 pain   PT SHORT TERM GOAL #4   Title demo improved Rt shoulder ER to 45 degrees or better to improve function   Time 4   Period Weeks   Status On-going           PT Long Term Goals - 05/02/14 1610    PT LONG TERM GOAL #1   Title demonstrate RICE understanding    Time 8   Status Achieved   PT LONG TERM GOAL #2   Title be independent with advanced HEP for ROM and strength   Time 8   Period Weeks   Status Partially Met   PT LONG TERM GOAL #3   Title demo improved Rt shoulder flexion and abduction to 150 degrees to better normalize ADLs   Time 8   Period Weeks   Status On-going   PT LONG TERM GOAL #4   Title reach behind his back with Rt arm for ADLs with minimal discomfort   Time 8   Period Weeks   Status On-going               Plan - 05/02/14 0920    Clinical Impression Statement Patient needed verbal and tactile educaiton for scapular postion throughout exercises today. Nothing increased pain. Pt with decreased sense of scapular stabiity.   Pt will benefit from skilled therapeutic intervention in order to improve on the following deficits Decreased range of motion;Improper body mechanics;Postural dysfunction;Decreased endurance;Decreased activity tolerance;Increased muscle spasms;Impaired UE functional use;Decreased strength;Decreased mobility   Rehab Potential Good   Clinical Impairments Affecting Rehab Potential None   PT Frequency 2x / week   PT Duration 6 weeks   PT Treatment/Interventions Moist Heat;Passive range of motion;Therapeutic exercise;Ultrasound;Manual techniques;Neuromuscular re-education;Cryotherapy;Electrical Stimulation   PT Next Visit Plan Continue to improve ROM and strength. Measure ROM   Consulted and Agree with Plan of Care Patient        Problem List Patient Active Problem List   Diagnosis Date Noted  . Personal history of colonic polyps-adenoma 06/20/2013    Brystal Kildow,PTA 05/02/2014, 9:29 AM  [facility] [address] [place], [place], [address] Phone: [phone]   Fax:  [phone]

## 2014-05-06 ENCOUNTER — Encounter: Payer: Self-pay | Admitting: Physical Therapy

## 2014-05-07 ENCOUNTER — Ambulatory Visit: Payer: BLUE CROSS/BLUE SHIELD | Admitting: Physical Therapy

## 2014-05-09 ENCOUNTER — Ambulatory Visit: Payer: BLUE CROSS/BLUE SHIELD | Admitting: Physical Therapy

## 2014-05-09 ENCOUNTER — Encounter: Payer: Self-pay | Admitting: Physical Therapy

## 2014-05-09 DIAGNOSIS — R29898 Other symptoms and signs involving the musculoskeletal system: Secondary | ICD-10-CM

## 2014-05-09 DIAGNOSIS — Z4789 Encounter for other orthopedic aftercare: Secondary | ICD-10-CM | POA: Diagnosis not present

## 2014-05-09 DIAGNOSIS — M25611 Stiffness of right shoulder, not elsewhere classified: Secondary | ICD-10-CM

## 2014-05-09 NOTE — Therapy (Signed)
[facility] [address] [place], [place], [address] Phone: [phone]   Fax:  [phone]  Physical Therapy Treatment  Patient Details  Name: Zachary Dunn MRN: [id number] Date of Birth: 08-30-1950 Referring Provider:  Arline Asp, MD  Encounter Date: 05/09/2014      PT End of Session - 05/09/14 1059    Visit Number 14   Date for PT Re-Evaluation 06/01/14   PT Start Time 1000   PT Stop Time 1045   PT Time Calculation (min) 45 min   Activity Tolerance Patient tolerated treatment well   Behavior During Therapy [facility] for tasks assessed/performed      Past Medical History  Diagnosis Date  . Hypertension   . Colon polyp   . Diabetes mellitus     Type 2  . Hyperlipemia   . Sleep apnea     wears CPAP mask nightly     Past Surgical History  Procedure Laterality Date  . Colonoscopy    . Shoulder arthroscopy with rotator cuff repair and subacromial decompression Right 02/21/2014    Procedure: RIGHT SHOULDER ARTHROSCOPYSUBACROMIAL DECOMPRESSION WITH EXTENSIVE DEBRIDEMENT;  Surgeon: Marianna Payment, MD;  Location: [facility];  Service: Orthopedics;  Laterality: Right;    There were no vitals taken for this visit.  Visit Diagnosis:  Shoulder joint stiffness, right  Weakness of shoulder      Subjective Assessment - 05/09/14 1003    Symptoms Patient reports every week his shoulder is feeling better. Patient reports difficulty with lifting arm out to the side. Patient is able to sleep through the night now.    Limitations Lifting  difficulty with lifting out to the side   Patient Stated Goals get right shoulder abuction stronger   Currently in Pain? Yes   Pain Score 2   1-2/10 at rest, lift item out to side 10/10   Pain Location Shoulder   Pain Orientation Right   Pain Descriptors / Indicators Sharp   Pain Type Chronic pain   Pain Radiating Towards right upper arm   Pain Onset More than a month ago   Pain Frequency Intermittent   Aggravating Factors  lifting and object into abduction    Pain Relieving Factors change postions   Effect of Pain on Daily Activities pain with lifting out to side   Multiple Pain Sites No          [facility] PT Assessment - 05/09/14 0001    AROM   AROM Assessment Site Shoulder   Right/Left Shoulder Right   Right Shoulder Flexion 149 Degrees   Right Shoulder ABduction 132 Degrees   Right Shoulder Internal Rotation 70 Degrees  reach to right gluteal   Right Shoulder External Rotation 80 Degrees   Strength   Strength Assessment Site Shoulder   Right/Left Shoulder Right   Right Shoulder Flexion 4+/5   Right Shoulder Extension 5/5   Right Shoulder ABduction 4-/5   Right Shoulder Internal Rotation 5/5   Right Shoulder External Rotation 4+/5                  [facility] Adult PT Treatment/Exercise - 05/09/14 0001    Lumbar Exercises: Seated   Other Seated Lumbar Exercises trunk rotation in sitting to increase thoracic and cervical rotation for shoulder ROM   Shoulder Exercises: Supine   Internal Rotation AAROM;Right;10 reps  used towel roll under axilla, distraction by therapist   Other Supine Exercises lay on foam roll bilateral shoulder flexion  10 and abduction 10   therapist guided rt. shoulder to relax upper trap   Shoulder Exercises: Standing   ABduction AROM;Right;10 reps  therapist guided rt. scapula and tactile cues to relax trap   Extension AROM;Both;10 reps  hand clasped behind him bringing them down and back   Shoulder Exercises: Pulleys   Flexion 3 minutes   ABduction 3 minutes   Manual Therapy   Manual Therapy Joint mobilization;Massage;Myofascial release;Scapular mobilization   Joint Mobilization to rt. shoulder for distraction and lateral glide, rib mobilization to increased shoulder IR , T1 T8 joint mobililation to increase extension to increase  shoulder ROM   Massage soft tissue work to right upper trap, bicep, and pectoralis    Scapular Mobilization to assist with rotation with shoulder abduction and IR   Passive ROM ot right shoulder for all ranges                PT Education - 05/09/14 1059    Education provided Yes   Person(s) Educated Patient   Methods Explanation;Demonstration;Tactile cues   Comprehension Verbalized understanding;Returned demonstration          PT Short Term Goals - 05/09/14 1009    PT SHORT TERM GOAL #1   Title be independent with initial HEP for ROM and strength   Time 4   Period Weeks   Status Achieved   PT SHORT TERM GOAL #2   Title perform overhead activities with 50% greater ease   Time 4   Period Weeks   Status Achieved   PT SHORT TERM GOAL #3   Title reach behind his back with 4/10 pain or less   Time 4   Period Weeks   Status Achieved   PT SHORT TERM GOAL #4   Title demo improved Rt shoulder ER to 45 degrees or better to improve function   Time 4   Period Weeks           PT Long Term Goals - 05/09/14 1011    PT LONG TERM GOAL #1   Title demonstrate RICE understanding   Period Weeks   Status Achieved   PT LONG TERM GOAL #2   Title be independent with advanced HEP for ROM and strength   Time 8   Period Weeks   Status Partially Met  continues to learn exercises   PT LONG TERM GOAL #3   Title demo improved Rt shoulder flexion and abduction to 150 degrees to better normalize ADLs   Time 8   Period Weeks   Status On-going  flex 149, abduction 130   PT LONG TERM GOAL #4   Title reach behind his back with Rt arm for ADLs with minimal discomfort   Time 8   Period Weeks   Status On-going  only able to get to right gluteals.    PT LONG TERM GOAL #5   Title demo Rt shoulder ER to [facility] to perform ADLs   Time 8   Period Weeks   Status Achieved   PT LONG TERM GOAL #6   Title demo 5/5 Rt shoulder strength   Time 8   Period Weeks   Status On-going  strength 4-/5 to 4+/5               Plan - 05/09/14 1100    Clinical Impression  Statement Patient required tactile cues to relax the upper trapezius with shoulder abduction and flexion.  Patient had decreased thoracic mobility making it difficulty to fully abduct  or flex right shoulder fully .   Pt will benefit from skilled therapeutic intervention in order to improve on the following deficits Impaired flexibility;Postural dysfunction;Decreased coordination;Increased fascial restricitons;Increased muscle spasms;Decreased mobility;Decreased strength;Pain   Rehab Potential Good   Clinical Impairments Affecting Rehab Potential None   PT Frequency 2x / week   PT Duration 6 weeks   PT Treatment/Interventions Therapeutic activities;Patient/family education;Passive range of motion;Therapeutic exercise;Manual techniques;Neuromuscular re-education;Functional mobility training   PT Next Visit Plan work on right shoulder abduction strenght, shoulder IR, reduce contraction of upper trapezius   PT Home Exercise Plan shoulder abduction strengthening   Consulted and Agree with Plan of Care Patient        Problem List Patient Active Problem List   Diagnosis Date Noted  . Personal history of colonic polyps-adenoma 06/20/2013    Cindel Daugherty, PT 05/09/2014, 11:03 AM  [facility] [address] [place], [place], [address] Phone: [phone]   Fax:  [phone]

## 2014-05-09 NOTE — Patient Instructions (Signed)
Physical therapist instructed patient on trunk rotation in sitting to hold 15 seconds 2 times each direction 3 times per day. Physical therapist instructed patient on clasping hands in back together and moving hands downward and away from body holding 5 seconds 3 times 3 times per day.  Patient able to return demonstration correctly.

## 2014-05-14 ENCOUNTER — Ambulatory Visit: Payer: BLUE CROSS/BLUE SHIELD | Admitting: Physical Therapy

## 2014-05-16 ENCOUNTER — Ambulatory Visit: Payer: BLUE CROSS/BLUE SHIELD

## 2014-05-16 DIAGNOSIS — M25611 Stiffness of right shoulder, not elsewhere classified: Secondary | ICD-10-CM

## 2014-05-16 DIAGNOSIS — M25511 Pain in right shoulder: Secondary | ICD-10-CM

## 2014-05-16 DIAGNOSIS — R29898 Other symptoms and signs involving the musculoskeletal system: Secondary | ICD-10-CM

## 2014-05-16 DIAGNOSIS — Z4789 Encounter for other orthopedic aftercare: Secondary | ICD-10-CM | POA: Diagnosis not present

## 2014-05-16 NOTE — Therapy (Signed)
[facility] [address], [place], [place], [address] Phone: [phone]   Fax:  [phone]  Physical Therapy Treatment  Patient Details  Name: Zachary Dunn MRN: [id number] Date of Birth: Mar 05, 1950 Referring Provider:  Leandrew Koyanagi, MD  Encounter Date: 05/16/2014      PT End of Session - 05/16/14 1048    Visit Number 15   Date for PT Re-Evaluation 06/01/14   PT Start Time 1001   PT Stop Time 1042   PT Time Calculation (min) 41 min   Activity Tolerance Patient tolerated treatment well   Behavior During Therapy [facility] for tasks assessed/performed      Past Medical History  Diagnosis Date  . Hypertension   . Colon polyp   . Diabetes mellitus     Type 2  . Hyperlipemia   . Sleep apnea     wears CPAP mask nightly     Past Surgical History  Procedure Laterality Date  . Colonoscopy    . Shoulder arthroscopy with rotator cuff repair and subacromial decompression Right 02/21/2014    Procedure: RIGHT SHOULDER ARTHROSCOPYSUBACROMIAL DECOMPRESSION WITH EXTENSIVE DEBRIDEMENT;  Surgeon: Marianna Payment, MD;  Location: [facility];  Service: Orthopedics;  Laterality: Right;    There were no vitals filed for this visit.  Visit Diagnosis:  Shoulder joint stiffness, right  Weakness of shoulder  Pain in joint, shoulder region, right      Subjective Assessment - 05/16/14 1017    Symptoms Lt shoulder is feeling better.     Pain Score 3    Pain Location Shoulder   Pain Orientation Right   Pain Descriptors / Indicators Sharp   Pain Type Chronic pain   Pain Onset More than a month ago   Pain Frequency Intermittent   Aggravating Factors  reaching out to the side with straight arm.   Pain Relieving Factors not reaching out to the side   Multiple Pain Sites No            [facility] PT Assessment - 05/16/14 0001    AROM   AROM Assessment Site Shoulder   Right/Left Shoulder Right   Right Shoulder Flexion 131  Degrees   Right Shoulder ABduction 122 Degrees  scapular substitution with flex and abduction today                   [facility] Adult PT Treatment/Exercise - 05/16/14 0001    Shoulder Exercises: Pulleys   Flexion 3 minutes   ABduction 3 minutes   Shoulder Exercises: ROM/Strengthening   UBE (Upper Arm Bike) L6 4x4   Ball on Wall 2# 3x10 clockwis and counter clockwise 3x10 forward, 2x10 out to the side   Other ROM/Strengthening Exercises wall push-ups 3x10   Shoulder Exercises: Stretch   Other Shoulder Stretches levator stretch 3x20 seconds   Shoulder Exercises: Power [employer] 25 reps  35#   External Rotation 25 reps  25#   Internal Rotation 25 reps  35#   Flexion 25 reps  35#                  PT Short Term Goals - 05/16/14 1026    PT SHORT TERM GOAL #3   Title reach behind his back with 4/10 pain or less   Time 4   Period Weeks   Status On-going  pt reports 6-7/10 in Rt shoulder           PT Long Term  Goals - 05/16/14 1026    PT LONG TERM GOAL #1   Title demonstrate RICE understanding   Status Achieved   PT LONG TERM GOAL #3   Title demo improved Rt shoulder flexion and abduction to 150 degrees to better normalize ADLs   Time 8   Period Weeks   Status On-going  flexion 131, 122 abduction               Plan - 05/16/14 1033    Clinical Impression Statement Pt with continued Rt shoulder pain with reaching out to the side.  See goals for assessment and AROM taken today.  Pt with scapular substitution with AROM of the Rt shoulder overhead.   Pt will benefit from skilled therapeutic intervention in order to improve on the following deficits Impaired flexibility;Postural dysfunction;Decreased coordination;Increased fascial restricitons;Increased muscle spasms;Decreased mobility;Decreased strength;Pain   Rehab Potential Good   PT Frequency 2x / week   PT Duration 6 weeks   PT Treatment/Interventions Therapeutic activities;Patient/family  education;Passive range of motion;Therapeutic exercise;Manual techniques;Neuromuscular re-education;Functional mobility training   PT Next Visit Plan Rt shoulder strength, scapular strength, ROM.  Manual therapy to thoracic spine and scapula.   Consulted and Agree with Plan of Care Patient        Problem List Patient Active Problem List   Diagnosis Date Noted  . Personal history of colonic polyps-adenoma 06/20/2013    TAKACS,KELLY, PT 05/16/2014, 10:49 AM  [facility] [address] [place], [place], [address] Phone: [phone]   Fax:  [phone]

## 2014-05-21 ENCOUNTER — Ambulatory Visit: Payer: BLUE CROSS/BLUE SHIELD

## 2014-05-23 ENCOUNTER — Encounter: Payer: Self-pay | Admitting: Physical Therapy

## 2014-05-23 ENCOUNTER — Ambulatory Visit: Payer: BLUE CROSS/BLUE SHIELD | Admitting: Physical Therapy

## 2014-05-23 DIAGNOSIS — M25611 Stiffness of right shoulder, not elsewhere classified: Secondary | ICD-10-CM

## 2014-05-23 DIAGNOSIS — Z4789 Encounter for other orthopedic aftercare: Secondary | ICD-10-CM | POA: Diagnosis not present

## 2014-05-23 DIAGNOSIS — M25511 Pain in right shoulder: Secondary | ICD-10-CM

## 2014-05-23 DIAGNOSIS — R29898 Other symptoms and signs involving the musculoskeletal system: Secondary | ICD-10-CM

## 2014-05-23 NOTE — Therapy (Signed)
[facility] [address] [place], [place], [address] Phone: [phone]   Fax:  [phone]  Physical Therapy Treatment  Patient Details  Name: Zachary Dunn MRN: [id number] Date of Birth: 02/03/1951 Referring Provider:  Xu, Naiping M, MD  Encounter Date: 05/23/2014      PT End of Session - 05/23/14 1058    Visit Number 16   Date for PT Re-Evaluation 06/01/14   PT Start Time 1015   PT Stop Time 1100   PT Time Calculation (min) 45 min   Activity Tolerance Patient tolerated treatment well   Behavior During Therapy [facility] for tasks assessed/performed      Past Medical History  Diagnosis Date  . Hypertension   . Colon polyp   . Diabetes mellitus     Type 2  . Hyperlipemia   . Sleep apnea     wears CPAP mask nightly     Past Surgical History  Procedure Laterality Date  . Colonoscopy    . Shoulder arthroscopy with rotator cuff repair and subacromial decompression Right 02/21/2014    Procedure: RIGHT SHOULDER ARTHROSCOPYSUBACROMIAL DECOMPRESSION WITH EXTENSIVE DEBRIDEMENT;  Surgeon: Naiping Michael Xu, MD;  Location: [facility];  Service: Orthopedics;  Laterality: Right;    There were no vitals filed for this visit.  Visit Diagnosis:  Shoulder joint stiffness, right  Weakness of shoulder  Pain in joint, shoulder region, right      Subjective Assessment - 05/23/14 1022    Symptoms I saw the surgeon and put a cortisone shot into the left shoulder due to inflammation. Let the doctor know I can not reach behind my back. Trouble lifting out to the side. Weakness with arm out to side and thumg down.   Limitations Lifting   Patient Stated Goals get right shoulder abuction stronger   Currently in Pain? Yes   Pain Score 2   past week can increase to 7/10   Pain Location Shoulder  upper arm   Pain Orientation Right   Pain Descriptors / Indicators Sharp;Aching   Pain Type Chronic pain   Pain Radiating Towards  right upper arm   Pain Onset More than a month ago   Pain Frequency Intermittent   Aggravating Factors  reaching behind back and out to the side.  Wakes up with increased pain. late in the evening pain increases   Pain Relieving Factors arm at side   Effect of Pain on Daily Activities pain with lifitng out to side   Multiple Pain Sites No     Poor scapular muscle control especially in prone.        [facility] PT Assessment - 05/23/14 0001    AROM   Right Shoulder Flexion 145 Degrees   Right Shoulder ABduction 154 Degrees                   [facility] Adult PT Treatment/Exercise - 05/23/14 0001    Shoulder Exercises: Seated   Flexion AROM;AAROM;Strengthening;Right;10 reps  10 using mulligan belt   Flexion Weight (lbs) 1 pound 10 x   Abduction AROM;AAROM;Strengthening;Right;10 reps  used mulligan belt 10 x to improve ROM   ABduction Weight (lbs) 1   Shoulder Exercises: ROM/Strengthening   UBE (Upper Arm Bike) L6 4x4                PT Education - 05/23/14 1057    Education provided Yes   Education Details shoulder ER, prone shoulder horizontal abduction   Person(s)   Educated Patient   Methods Explanation;Demonstration;Handout   Comprehension Verbalized understanding;Returned demonstration          PT Short Term Goals - 05/16/14 1026    PT SHORT TERM GOAL #3   Title reach behind his back with 4/10 pain or less   Time 4   Period Weeks   Status On-going  pt reports 6-7/10 in Rt shoulder           PT Long Term Goals - 05/23/14 1101    PT LONG TERM GOAL #2   Title be independent with advanced HEP for ROM and strength   Time 8   Status Partially Met  continues to learn exercises   PT LONG TERM GOAL #3   Title demo improved Rt shoulder flexion and abduction to 150 degrees to better normalize ADLs   Time 8   Period Weeks   Status On-going  145   PT LONG TERM GOAL #4   Title reach behind his back with Rt arm for ADLs with minimal discomfort   Time 8    Period Weeks   Status On-going  hardest to achieve due to  pain   PT LONG TERM GOAL #5   Title demo Rt shoulder ER to [facility] to perform ADLs   Time 8   Period Weeks   Status Achieved   PT LONG TERM GOAL #6   Title demo 5/5 Rt shoulder strength   Time 8   Period Weeks   Status On-going  still weak               Plan - 05/23/14 1100    Clinical Impression Statement Patient has tightness in the right shoulder capsule, weak scapular muscles especially in prone. After therapy patient had increased ROM   Pt will benefit from skilled therapeutic intervention in order to improve on the following deficits Impaired flexibility;Postural dysfunction;Decreased coordination;Increased fascial restricitons;Increased muscle spasms;Decreased mobility;Decreased strength;Pain   Clinical Impairments Affecting Rehab Potential None   PT Frequency 2x / week   PT Duration 6 weeks   PT Treatment/Interventions Therapeutic activities;Patient/family education;Passive range of motion;Therapeutic exercise;Manual techniques;Neuromuscular re-education;Functional mobility training   PT Next Visit Plan shoulder mobilization, scapular strengthening, mid range strengthening   PT Home Exercise Plan review HEP   Consulted and Agree with Plan of Care Patient        Problem List Patient Active Problem List   Diagnosis Date Noted  . Personal history of colonic polyps-adenoma 06/20/2013    Kingslee Dowse,PT 05/23/2014, 11:04 AM  [facility] [address], [place], [place], [address] Phone: [phone]   Fax:  [phone]

## 2014-05-23 NOTE — Patient Instructions (Addendum)
    Scapular Retraction: Abduction / Extension (Prone)   Lie with arms out from sides 90. Pinch shoulder blades together and raise arms a few inches from floor. Repeat __10__ times per set. Do __1__ sets per session. Do _1___ sessions per day. No pain  http://orth.exer.us/958   Copyright  VHI. All rights reserved.  External Rotation (Prone)   Lie with right upper arm straight out from body, elbow bent to 90, _0___ pound weight in hand. Rotate forearm up, keeping elbow bent. Return slowly. Repeat _10___ times per set. Do __1__ sets per session. Do __1__ sessions per day.  http://orth.exer.us/940   Copyright  VHI. All rights reserved.   Patient able to return demonstration correctly with above exercises.

## 2014-05-28 ENCOUNTER — Ambulatory Visit: Payer: BLUE CROSS/BLUE SHIELD | Admitting: Physical Therapy

## 2014-05-28 ENCOUNTER — Encounter: Payer: Self-pay | Admitting: Physical Therapy

## 2014-05-28 DIAGNOSIS — R29898 Other symptoms and signs involving the musculoskeletal system: Secondary | ICD-10-CM

## 2014-05-28 DIAGNOSIS — M25511 Pain in right shoulder: Secondary | ICD-10-CM

## 2014-05-28 DIAGNOSIS — M25611 Stiffness of right shoulder, not elsewhere classified: Secondary | ICD-10-CM

## 2014-05-28 DIAGNOSIS — Z4789 Encounter for other orthopedic aftercare: Secondary | ICD-10-CM | POA: Diagnosis not present

## 2014-05-28 NOTE — Patient Instructions (Signed)
Strengthening: Resisted Diagonal   Hold tubing with right arm down across body, thumb pointing back. Pull arm up and out, rotating arm to palm forward. Use yellow band.  Repeat _10x2___ times per set. Do 1____ sets per session. Do _1___ sessions per day.  http://orth.exer.us/840   Copyright  VHI. All rights reserved.

## 2014-05-28 NOTE — Therapy (Signed)
[facility] [address] [place], [place], [address] Phone: [phone]   Fax:  [phone]  Physical Therapy Treatment  Patient Details  Name: Zachary Dunn MRN: [id number] Date of Birth: 01/06/1951 Referring Provider:  Tarry Kos, MD  Encounter Date: 05/28/2014      PT End of Session - 05/28/14 1059    Visit Number 17   Date for PT Re-Evaluation 06/01/14   PT Start Time 1015   PT Stop Time 1120   PT Time Calculation (min) 65 min   Activity Tolerance Patient tolerated treatment well   Behavior During Therapy [facility] for tasks assessed/performed      Past Medical History  Diagnosis Date  . Hypertension   . Colon polyp   . Diabetes mellitus     Type 2  . Hyperlipemia   . Sleep apnea     wears CPAP mask nightly     Past Surgical History  Procedure Laterality Date  . Colonoscopy    . Shoulder arthroscopy with rotator cuff repair and subacromial decompression Right 02/21/2014    Procedure: RIGHT SHOULDER ARTHROSCOPYSUBACROMIAL DECOMPRESSION WITH EXTENSIVE DEBRIDEMENT;  Surgeon: Cheral Almas, MD;  Location: [facility] OR;  Service: Orthopedics;  Laterality: Right;    There were no vitals filed for this visit.  Visit Diagnosis:  Weakness of shoulder  Shoulder joint stiffness, right  Pain in joint, shoulder region, right      Subjective Assessment - 05/28/14 1021    Symptoms I see MD on 06/04/2014.  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 I have trouble reaching behind my back. I have slept for 4 nights in a row after cortisone shot.    Limitations Lifting   How long can you sit comfortably? in a race car with upper body strapped in tightly.    Patient Stated Goals get right shoulder abuction stronger   Currently in Pain? Yes   Pain Score 2    Pain Location Shoulder   Pain Orientation Right   Pain Descriptors /  Indicators Sharp   Pain Type Chronic pain   Pain Radiating Towards right upper arm   Pain Onset More than a month ago   Pain Frequency Intermittent   Aggravating Factors  reaching behind back   Pain Relieving Factors arm at side   Effect of Pain on Daily Activities pain with lifting out to side   Multiple Pain Sites No                       [facility] Adult PT Treatment/Exercise - 05/28/14 0001    Shoulder Exercises: Supine   Other Supine Exercises lay on red physioball PNF patterns to open up ant. shoulder   Other Supine Exercises hands ehind head and extend thoracic over edge of mat   Shoulder Exercises: Prone   Retraction AROM;Strengthening;Right;20 reps;Weights   Retraction Weight (lbs) 6   Flexion AROM;Strengthening;Right;20 reps  tactile cues to assist scapula   Extension AROM;Strengthening;Right;20 reps;Weights  therapist guided scapula   Extension Weight (lbs) 6   External Rotation AROM;Strengthening;Right;20 reps  therapist assist scapula   Horizontal ABduction 1 AROM;Right;10 reps  needed tactile cues 2sets of 10   Other Prone Exercises prone on elbows scapula retraction/protraction   Other Prone Exercises diagonal on right 10 x no band, 20 x yellow band  Shoulder Exercises: Sidelying   Other Sidelying Exercises bring right arm behind head to stretch hold 15 sec x3   Moist Heat Therapy   Number Minutes Moist Heat 20 Minutes   Moist Heat Location Shoulder  sitting   Electrical Stimulation   Electrical Stimulation Location right shoulder   Electrical Stimulation Action IFC   Electrical Stimulation Parameters 80-150 hz   Electrical Stimulation Goals Pain                PT Education - 05/28/14 1059    Education provided Yes   Education Details right shoulder PNF   Methods Explanation;Demonstration;Handout   Comprehension Verbalized understanding;Returned demonstration          PT Short Term Goals - 05/16/14 1026    PT SHORT TERM GOAL #3    Title reach behind his back with 4/10 pain or less   Time 4   Period Weeks   Status On-going  pt reports 6-7/10 in Rt shoulder           PT Long Term Goals - 05/23/14 1101    PT LONG TERM GOAL #2   Title be independent with advanced HEP for ROM and strength   Time 8   Status Partially Met  continues to learn exercises   PT LONG TERM GOAL #3   Title demo improved Rt shoulder flexion and abduction to 150 degrees to better normalize ADLs   Time 8   Period Weeks   Status On-going  145   PT LONG TERM GOAL #4   Title reach behind his back with Rt arm for ADLs with minimal discomfort   Time 8   Period Weeks   Status On-going  hardest to achieve due to  pain   PT LONG TERM GOAL #5   Title demo Rt shoulder ER to [facility] to perform ADLs   Time 8   Period Weeks   Status Achieved   PT LONG TERM GOAL #6   Title demo 5/5 Rt shoulder strength   Time 8   Period Weeks   Status On-going  still weak               Plan - 05/28/14 1100    Clinical Impression Statement Patient has weak scapular muscles making it difficult to return to all of his activities.    Pt will benefit from skilled therapeutic intervention in order to improve on the following deficits Impaired flexibility;Postural dysfunction;Decreased coordination;Increased fascial restricitons;Increased muscle spasms;Decreased mobility;Decreased strength;Pain   Rehab Potential Good   Clinical Impairments Affecting Rehab Potential None   PT Frequency 2x / week   PT Duration 6 weeks   PT Treatment/Interventions Therapeutic activities;Patient/family education;Passive range of motion;Therapeutic exercise;Manual techniques;Neuromuscular re-education;Functional mobility training   PT Next Visit Plan write renewal to continue therapy for 4 weeks to further strengthen his right shoulder so he can lift, get in and out of race car, tolerate pressure of seatbelt while in race car.    PT Home Exercise Plan continue with present HEp    Consulted and Agree with Plan of Care Patient        Problem List Patient Active Problem List   Diagnosis Date Noted  . Personal history of colonic polyps-adenoma 06/20/2013    Neomi Laidler,PT 05/28/2014, 11:02 AM  [facility] [address], [place], [place], [address] Phone: [phone]   Fax:  [phone]

## 2014-05-30 ENCOUNTER — Ambulatory Visit: Payer: BLUE CROSS/BLUE SHIELD

## 2014-05-30 DIAGNOSIS — Z4789 Encounter for other orthopedic aftercare: Secondary | ICD-10-CM | POA: Diagnosis not present

## 2014-05-30 DIAGNOSIS — R29898 Other symptoms and signs involving the musculoskeletal system: Secondary | ICD-10-CM

## 2014-05-30 DIAGNOSIS — M25611 Stiffness of right shoulder, not elsewhere classified: Secondary | ICD-10-CM

## 2014-05-30 NOTE — Therapy (Signed)
[facility] [address], [address] [place], [place], [address] Phone: [phone]   Fax:  [phone]  Physical Therapy Treatment  Patient Details  Name: Zachary Dunn MRN: [id number] Date of Birth: 1950-06-18 Referring Provider:  Tarry Kos, MD  Encounter Date: 05/30/2014      PT End of Session - 05/30/14 1053    Visit Number 18   Date for PT Re-Evaluation 06/29/14   PT Start Time 1014   PT Stop Time 1055   PT Time Calculation (min) 41 min   Activity Tolerance Patient tolerated treatment well   Behavior During Therapy [facility] for tasks assessed/performed      Past Medical History  Diagnosis Date  . Hypertension   . Colon polyp   . Diabetes mellitus     Type 2  . Hyperlipemia   . Sleep apnea     wears CPAP mask nightly     Past Surgical History  Procedure Laterality Date  . Colonoscopy    . Shoulder arthroscopy with rotator cuff repair and subacromial decompression Right 02/21/2014    Procedure: RIGHT SHOULDER ARTHROSCOPYSUBACROMIAL DECOMPRESSION WITH EXTENSIVE DEBRIDEMENT;  Surgeon: Cheral Almas, MD;  Location: [facility] OR;  Service: Orthopedics;  Laterality: Right;    There were no vitals filed for this visit.  Visit Diagnosis:  Weakness of shoulder - Plan: PT plan of care cert/re-cert  Shoulder joint stiffness, right - Plan: PT plan of care cert/re-cert      Subjective Assessment - 05/30/14 1008    Symptoms Pt is going to MD 06/04/14.  Pt had cortizone injection last week and it helped.  Pain is present on and off.   How long can you sit comfortably? in a race car with upper body strapped in tightly.    Currently in Pain? Yes   Pain Score 2    Pain Location Shoulder   Pain Orientation Right   Pain Descriptors / Indicators Sharp   Pain Type Chronic pain   Pain Onset More than a month ago   Pain Frequency Intermittent   Multiple Pain Sites No            [facility] PT Assessment - 05/30/14 0001     AROM   AROM Assessment Site Shoulder   Right/Left Shoulder Right   Right Shoulder Flexion 134 Degrees   Right Shoulder Internal Rotation --  lacking 7 inches vs the Lt   Strength   Strength Assessment Site Shoulder   Right/Left Shoulder Right   Right Shoulder Flexion 5/5   Right Shoulder Extension 5/5   Right Shoulder ABduction 4/5   Right Shoulder Internal Rotation 5/5   Right Shoulder External Rotation 5/5                   [facility] Adult PT Treatment/Exercise - 05/30/14 0001    Shoulder Exercises: Supine   Other Supine Exercises supine on foam roll with D2-yellow band   Shoulder Exercises: Prone   Retraction AROM;Strengthening;Right;20 reps;Weights   Retraction Weight (lbs) 6   Flexion AROM;Strengthening;Right;20 reps  tactile cues to assist scapula   Extension AROM;Strengthening;Right;20 reps;Weights  therapist guided scapula   Extension Weight (lbs) 6   External Rotation AROM;Strengthening;Right;20 reps  therapist assist scapula   Horizontal ABduction 1 AROM;Right;10 reps  needed tactile cues 2sets of 10   Shoulder Exercises: Standing   Flexion Strengthening   Shoulder Flexion Weight (lbs) 4   Flexion Limitations scaption   ABduction Strengthening;Right;20 reps;Weights  Shoulder ABduction Weight (lbs) 4                  PT Short Term Goals - 05/16/14 1026    PT SHORT TERM GOAL #3   Title reach behind his back with 4/10 pain or less   Time 4   Period Weeks   Status On-going  pt reports 6-7/10 in Rt shoulder           PT Long Term Goals - 05/30/14 1014    PT LONG TERM GOAL #1   Title demonstrate RICE understanding   Status Achieved   PT LONG TERM GOAL #2   Title be independent with advanced HEP for ROM and strength   Status On-going  Met for current HEP   PT LONG TERM GOAL #3   Title demo improved Rt shoulder flexion and abduction to 150 degrees to better normalize ADLs   Time 4   Period Weeks   Status On-going  134 degrees   PT  LONG TERM GOAL #4   Title reach behind his back with Rt arm for ADLs with minimal discomfort   Time 4   Period Weeks   Status On-going  Not painful, just limited AROM. Limited by 7 inches vs the Lt   PT LONG TERM GOAL #5   Title demo Rt shoulder ER to [facility] to perform ADLs   Status Achieved   Additional Long Term Goals   Additional Long Term Goals Yes   PT LONG TERM GOAL #6   Title demo 5/5 Rt shoulder strength   Time 4   Period Weeks   Status On-going  5/5 except 4/5 abduction on the Rt   PT LONG TERM GOAL #7   Title get in/out of race car without limitation due to strength and AROM of Rt UE   Time 4   Period Weeks   Status New               Plan - 05/30/14 1026    Clinical Impression Statement Pt with continued weakness into Rt shoulder abduction and with reaching behind the back.  Pt will continue to advance this strength and AROM.  See goals for assessment.   Pt will benefit from skilled therapeutic intervention in order to improve on the following deficits Impaired flexibility;Postural dysfunction;Decreased coordination;Increased fascial restricitons;Increased muscle spasms;Decreased mobility;Decreased strength;Pain   Rehab Potential Good   PT Frequency 2x / week   PT Duration 4 weeks   PT Treatment/Interventions Therapeutic activities;Patient/family education;Passive range of motion;Therapeutic exercise;Manual techniques;Neuromuscular re-education;Functional mobility training   PT Next Visit Plan continue to advance strength, endurance and ROM of Rt shoulder to improve function.     Consulted and Agree with Plan of Care Patient        Problem List Patient Active Problem List   Diagnosis Date Noted  . Personal history of colonic polyps-adenoma 06/20/2013    Yorel Redder, PT 05/30/2014, 10:57 AM  [facility] [address], [place], [place], [address] Phone: [phone]   Fax:   [phone]

## 2014-06-06 ENCOUNTER — Ambulatory Visit: Payer: BLUE CROSS/BLUE SHIELD | Attending: Orthopaedic Surgery

## 2014-06-06 DIAGNOSIS — M25611 Stiffness of right shoulder, not elsewhere classified: Secondary | ICD-10-CM | POA: Diagnosis not present

## 2014-06-06 DIAGNOSIS — Z4789 Encounter for other orthopedic aftercare: Secondary | ICD-10-CM | POA: Diagnosis present

## 2014-06-06 DIAGNOSIS — R29898 Other symptoms and signs involving the musculoskeletal system: Secondary | ICD-10-CM

## 2014-06-06 DIAGNOSIS — M25511 Pain in right shoulder: Secondary | ICD-10-CM | POA: Diagnosis not present

## 2014-06-06 NOTE — Therapy (Signed)
[facility] [address] [place], [place], [address] Phone: [phone]   Fax:  [phone]  Physical Therapy Treatment  Patient Details  Name: Zachary Dunn MRN: [id number] Date of Birth: [birth year] Referring Provider:  Chesley Noon, MD  Encounter Date: 06/06/2014      PT End of Session - 06/06/14 1045    Visit Number 19   Date for PT Re-Evaluation 06/29/14   PT Start Time 1010   PT Stop Time 1049   PT Time Calculation (min) 39 min   Activity Tolerance Patient tolerated treatment well   Behavior During Therapy [facility] for tasks assessed/performed      Past Medical History  Diagnosis Date  . Hypertension   . Colon polyp   . Diabetes mellitus     Type 2  . Hyperlipemia   . Sleep apnea     wears CPAP mask nightly     Past Surgical History  Procedure Laterality Date  . Colonoscopy    . Shoulder arthroscopy with rotator cuff repair and subacromial decompression Right 02/21/2014    Procedure: RIGHT SHOULDER ARTHROSCOPYSUBACROMIAL DECOMPRESSION WITH EXTENSIVE DEBRIDEMENT;  Surgeon: Marianna Payment, MD;  Location: [place];  Service: Orthopedics;  Laterality: Right;    There were no vitals filed for this visit.  Visit Diagnosis:  Weakness of shoulder  Shoulder joint stiffness, right  Pain in joint, shoulder region, right      Subjective Assessment - 06/06/14 1009    Subjective MD signed renewal to extend PT for strength.  Went and hit golf balls yesterday.     Currently in Pain? Yes   Pain Score 0-No pain   Pain Location Shoulder   Pain Orientation Right   Pain Descriptors / Indicators Sore                       [facility] Adult PT Treatment/Exercise - 06/06/14 0001    Shoulder Exercises: Supine   Other Supine Exercises supine on foam roll with D2-red band  3x10   Other Supine Exercises supine on foam roll: horizontal abduction green 3x10   Shoulder Exercises: Prone   Retraction  AROM;Strengthening;Right;20 reps;Weights   Retraction Weight (lbs) 6   Flexion AROM;Strengthening;Right;20 reps   Flexion Weight (lbs) 6   Extension AROM;Strengthening;Right;20 reps;Weights  Pt with excellent motion   Extension Weight (lbs) 6   External Rotation AROM;Strengthening;Right;20 reps  therapist assist scapula   External Rotation Weight (lbs) 2   Horizontal ABduction 1 AROM;Right;10 reps  needed tactile cues 2sets of 10   Horizontal ABduction 1 Weight (lbs) 6   Shoulder Exercises: Standing   Flexion Strengthening;Other (comment)  30   Shoulder Flexion Weight (lbs) 4   Flexion Limitations scaption  3x10   ABduction Strengthening;Right;Weights;Other (comment)  30 reps   Shoulder ABduction Weight (lbs) 4   Shoulder Exercises: [employer] Other (comment)  3x10 45#   External Rotation Other (comment)  25# 3x10   Internal Rotation Other (comment)  35# 3x10                  PT Short Term Goals - 05/16/14 1026    PT SHORT TERM GOAL #3   Title reach behind his back with 4/10 pain or less   Time 4   Period Weeks   Status On-going  pt reports 6-7/10 in Rt shoulder           PT Long Term Goals -  06/06/14 1014    PT LONG TERM GOAL #1   Title demonstrate RICE understanding   Status Achieved   PT LONG TERM GOAL #2   Title be independent with advanced HEP for ROM and strength   Time 4   Period Weeks   Status On-going   PT LONG TERM GOAL #3   Title demo improved Rt shoulder flexion and abduction to 150 degrees to better normalize ADLs   Status On-going  Not measured today   PT LONG TERM GOAL #4   Title reach behind his back with Rt arm for ADLs with minimal discomfort   Time 4   Period Weeks   Status On-going  2-3/10 with use for ADLs               Plan - 06/06/14 1015    Clinical Impression Statement No significant change in status since MD note complete last session.  Pt able to tolerate high level strength training in the clinic  today.     Pt will benefit from skilled therapeutic intervention in order to improve on the following deficits Impaired flexibility;Postural dysfunction;Decreased coordination;Increased fascial restricitons;Increased muscle spasms;Decreased mobility;Decreased strength;Pain   Rehab Potential Good   PT Frequency 2x / week   PT Duration 4 weeks   PT Treatment/Interventions Therapeutic activities;Patient/family education;Passive range of motion;Therapeutic exercise;Manual techniques;Neuromuscular re-education;Functional mobility training   PT Next Visit Plan continue to advance strength, endurance and ROM of Rt shoulder to improve function.          Problem List Patient Active Problem List   Diagnosis Date Noted  . Personal history of colonic polyps-adenoma 06/20/2013     Tyresha Fede, PT  06/06/2014, 10:51 AM  [facility] [address] [place], [place], [address] Phone: [phone]   Fax:  [phone]

## 2014-06-11 ENCOUNTER — Ambulatory Visit: Payer: BLUE CROSS/BLUE SHIELD | Admitting: Physical Therapy

## 2014-06-11 ENCOUNTER — Encounter: Payer: Self-pay | Admitting: Physical Therapy

## 2014-06-11 DIAGNOSIS — Z4789 Encounter for other orthopedic aftercare: Secondary | ICD-10-CM | POA: Diagnosis not present

## 2014-06-11 DIAGNOSIS — M25611 Stiffness of right shoulder, not elsewhere classified: Secondary | ICD-10-CM

## 2014-06-11 DIAGNOSIS — R29898 Other symptoms and signs involving the musculoskeletal system: Secondary | ICD-10-CM

## 2014-06-11 NOTE — Therapy (Signed)
[facility] [address], [place], [place], [address] Phone: [phone]   Fax:  [phone]  Physical Therapy Treatment  Patient Details  Name: Zachary Dunn MRN: [id number] Date of Birth: 12/22/1950 Referring Provider:  Leandrew Koyanagi, MD  Encounter Date: 06/11/2014      PT End of Session - 06/11/14 1218    Visit Number 20   Date for PT Re-Evaluation 06/29/14   PT Start Time 1145   PT Stop Time 1230   PT Time Calculation (min) 45 min   Activity Tolerance Patient tolerated treatment well   Behavior During Therapy [facility] for tasks assessed/performed      Past Medical History  Diagnosis Date  . Hypertension   . Colon polyp   . Diabetes mellitus     Type 2  . Hyperlipemia   . Sleep apnea     wears CPAP mask nightly     Past Surgical History  Procedure Laterality Date  . Colonoscopy    . Shoulder arthroscopy with rotator cuff repair and subacromial decompression Right 02/21/2014    Procedure: RIGHT SHOULDER ARTHROSCOPYSUBACROMIAL DECOMPRESSION WITH EXTENSIVE DEBRIDEMENT;  Surgeon: Marianna Payment, MD;  Location: [place];  Service: Orthopedics;  Laterality: Right;    There were no vitals filed for this visit.  Visit Diagnosis:  Weakness of shoulder  Shoulder joint stiffness, right      Subjective Assessment - 06/11/14 1149    Subjective I am doing better. Patient reports he is feeling stronger out to the side.  Patient reports it is getting easier to reach behind his back. Patient has mild difficulty with taking seatbelt off with racing.    Limitations Lifting   How long can you sit comfortably? in a race car with upper body strapped in tightly.    Patient Stated Goals get right shoulder abuction stronger   Currently in Pain? No/denies   Multiple Pain Sites No            [facility] PT Assessment - 06/11/14 0001    AROM   Right Shoulder Flexion 150 Degrees   Right Shoulder ABduction 165 Degrees   Right Shoulder Internal Rotation --  reach to L5 midline                   [facility] Adult PT Treatment/Exercise - 06/11/14 0001    Shoulder Exercises: Seated   Flexion Strengthening;Right;20 reps;Weights   Flexion Weight (lbs) 2   Abduction Strengthening;Right;20 reps;Weights   ABduction Weight (lbs) 2   Shoulder Exercises: Prone   Retraction Strengthening;10 reps  2 sets   Retraction Weight (lbs) 7   Flexion Strengthening;Right;10 reps;Weights  3 sets   Theraband Level (Shoulder Flexion) --  VC to keep elbow straight   Flexion Weight (lbs) 1   Extension Strengthening;Right;10 reps;Weights  3 sets   Extension Weight (lbs) 7   External Rotation Strengthening;Right;10 reps;Weights  3 sets   External Rotation Weight (lbs) 2   Horizontal ABduction 1 Strengthening;Both;10 reps;Weights  2 sets   Horizontal ABduction 1 Weight (lbs) 1  right arm   Shoulder Exercises: ROM/Strengthening   UBE (Upper Arm Bike) L4 x 6 min.   Manual Therapy   Manual Therapy Passive ROM   Joint Mobilization P-A mobilization grade 3   Passive ROM right internal rotation behind back                PT Education - 06/11/14 1224    Education provided No  PT Short Term Goals - 06/11/14 1159    PT SHORT TERM GOAL #3   Title reach behind his back with 4/10 pain or less   Time 4   Period Weeks   Status On-going  1/10           PT Long Term Goals - 06/11/14 1159    PT LONG TERM GOAL #2   Title be independent with advanced HEP for ROM and strength   Time 4   Period Weeks   Status On-going  continues to learn exercises   PT LONG TERM GOAL #3   Title demo improved Rt shoulder flexion and abduction to 150 degrees to better normalize ADLs   Time 4   Period Weeks   Status Achieved   PT LONG TERM GOAL #4   Title reach behind his back with Rt arm for ADLs with minimal discomfort   Period Weeks   Status Achieved               Plan - 06/11/14 1219    Clinical  Impression Statement Patient has increased right shoulder strength due to using increased weight with exercise.    Pt will benefit from skilled therapeutic intervention in order to improve on the following deficits Impaired flexibility;Postural dysfunction;Decreased coordination;Increased fascial restricitons;Increased muscle spasms;Decreased mobility;Decreased strength;Pain   Rehab Potential Good   Clinical Impairments Affecting Rehab Potential None   PT Frequency 2x / week   PT Duration 4 weeks   PT Treatment/Interventions Therapeutic activities;Patient/family education;Passive range of motion;Therapeutic exercise;Manual techniques;Neuromuscular re-education;Functional mobility training   PT Next Visit Plan posterior shoulder strengthening   PT Home Exercise Plan continue with present HEp   Consulted and Agree with Plan of Care Patient        Problem List Patient Active Problem List   Diagnosis Date Noted  . Personal history of colonic polyps-adenoma 06/20/2013    GRAY,CHERYL,PT 06/11/2014, 12:26 PM  [facility] [address] [place], [place], [address] Phone: [phone]   Fax:  [phone]

## 2014-06-13 ENCOUNTER — Ambulatory Visit: Payer: BLUE CROSS/BLUE SHIELD | Admitting: Physical Therapy

## 2014-06-13 ENCOUNTER — Encounter: Payer: Self-pay | Admitting: Physical Therapy

## 2014-06-13 DIAGNOSIS — M25511 Pain in right shoulder: Secondary | ICD-10-CM

## 2014-06-13 DIAGNOSIS — Z4789 Encounter for other orthopedic aftercare: Secondary | ICD-10-CM | POA: Diagnosis not present

## 2014-06-13 DIAGNOSIS — M25611 Stiffness of right shoulder, not elsewhere classified: Secondary | ICD-10-CM

## 2014-06-13 NOTE — Therapy (Signed)
[facility] [address], [place], [place], [address] Phone: [phone]   Fax:  [phone]  Physical Therapy Treatment  Patient Details  Name: Zachary Dunn MRN: [id number] Date of Birth: 23-Jul-1950 Referring Provider:  Leandrew Koyanagi, MD  Encounter Date: 06/13/2014      PT End of Session - 06/13/14 1055    Visit Number 21   Date for PT Re-Evaluation 06/29/14   PT Start Time 5809   PT Stop Time 1100   PT Time Calculation (min) 45 min   Activity Tolerance Patient tolerated treatment well   Behavior During Therapy [facility] for tasks assessed/performed      Past Medical History  Diagnosis Date  . Hypertension   . Colon polyp   . Diabetes mellitus     Type 2  . Hyperlipemia   . Sleep apnea     wears CPAP mask nightly     Past Surgical History  Procedure Laterality Date  . Colonoscopy    . Shoulder arthroscopy with rotator cuff repair and subacromial decompression Right 02/21/2014    Procedure: RIGHT SHOULDER ARTHROSCOPYSUBACROMIAL DECOMPRESSION WITH EXTENSIVE DEBRIDEMENT;  Surgeon: Marianna Payment, MD;  Location: [place];  Service: Orthopedics;  Laterality: Right;    There were no vitals filed for this visit.  Visit Diagnosis:  Shoulder joint stiffness, right  Pain in joint, shoulder region, right      Subjective Assessment - 06/13/14 1027    Subjective I had a sharp pain in the front of my right shoulder after therapy. I worked in my garden and did well. Patient reports reaching out to side with 90% less pain.    How long can you sit comfortably? in a race car with upper body strapped in tightly.    Patient Stated Goals get right shoulder abuction stronger   Currently in Pain? Yes   Pain Score 4    Pain Location Shoulder   Pain Orientation Right   Pain Descriptors / Indicators Sharp   Pain Type Chronic pain   Pain Onset More than a month ago   Pain Frequency Intermittent   Aggravating Factors   reaching behind back   Pain Relieving Factors arm at side                       [facility] Adult PT Treatment/Exercise - 06/13/14 0001    Shoulder Exercises: ROM/Strengthening   UBE (Upper Arm Bike) L6x8 min   Ultrasound   Ultrasound Location anterior right shoulder in sidely with arm behind back   Ultrasound Parameters 100%, 1.2 w/cm, 8 min, 26mhz   Manual Therapy   Manual Therapy Massage;Myofascial release   Massage anterior right shoulder, biceps, deltoid, A-C joint, pectoralis   Myofascial Release to right shoulder   Passive ROM right shoulder IR     Patient had increased thickness in anterior right shoulder, biceps and pectoralis muscle.            PT Education - 06/13/14 1054    Education provided Yes   Education Details right shoulder IR   Person(s) Educated Patient   Methods Explanation;Demonstration;Verbal cues   Comprehension Verbalized understanding;Returned demonstration          PT Short Term Goals - 06/11/14 1159    PT SHORT TERM GOAL #3   Title reach behind his back with 4/10 pain or less   Time 4   Period Weeks   Status On-going  1/10  PT Long Term Goals - 06/11/14 1159    PT LONG TERM GOAL #2   Title be independent with advanced HEP for ROM and strength   Time 4   Period Weeks   Status On-going  continues to learn exercises   PT LONG TERM GOAL #3   Title demo improved Rt shoulder flexion and abduction to 150 degrees to better normalize ADLs   Time 4   Period Weeks   Status Achieved   PT LONG TERM GOAL #4   Title reach behind his back with Rt arm for ADLs with minimal discomfort   Period Weeks   Status Achieved               Plan - 06/13/14 1056    Clinical Impression Statement Patient had increased right shoulder IR where he can get to L5 for first time.    Pt will benefit from skilled therapeutic intervention in order to improve on the following deficits Impaired flexibility;Postural  dysfunction;Decreased coordination;Increased fascial restricitons;Increased muscle spasms;Decreased mobility;Decreased strength;Pain   Rehab Potential Good   Clinical Impairments Affecting Rehab Potential None   PT Frequency 2x / week   PT Duration 4 weeks   PT Treatment/Interventions Therapeutic activities;Patient/family education;Passive range of motion;Therapeutic exercise;Manual techniques;Neuromuscular re-education;Functional mobility training   PT Next Visit Plan ultrasound, soft tissue work to increase IR   PT Home Exercise Plan continue with present HEp   Consulted and Agree with Plan of Care Patient        Problem List Patient Active Problem List   Diagnosis Date Noted  . Personal history of colonic polyps-adenoma 06/20/2013    GRAY,CHERYL,PT 06/13/2014, 10:58 AM  [facility] [address] [place], [place], [address] Phone: [phone]   Fax:  [phone]

## 2014-06-18 ENCOUNTER — Encounter: Payer: Self-pay | Admitting: Physical Therapy

## 2014-06-18 ENCOUNTER — Ambulatory Visit: Payer: BLUE CROSS/BLUE SHIELD | Admitting: Physical Therapy

## 2014-06-18 DIAGNOSIS — Z4789 Encounter for other orthopedic aftercare: Secondary | ICD-10-CM | POA: Diagnosis not present

## 2014-06-18 DIAGNOSIS — M25611 Stiffness of right shoulder, not elsewhere classified: Secondary | ICD-10-CM

## 2014-06-18 DIAGNOSIS — M25511 Pain in right shoulder: Secondary | ICD-10-CM

## 2014-06-18 NOTE — Therapy (Signed)
[facility] [address] [place], [place], [address] Phone: [phone]   Fax:  [phone]  Physical Therapy Treatment  Patient Details  Name: Zachary Dunn MRN: [id number] Date of Birth: [birth year] Referring Provider:  Leandrew Koyanagi, MD  Encounter Date: 06/18/2014      PT End of Session - 06/18/14 1009    Visit Number 22   Date for PT Re-Evaluation 06/29/14   PT Start Time 0930   PT Stop Time 1010   PT Time Calculation (min) 40 min   Activity Tolerance Patient tolerated treatment well   Behavior During Therapy [facility] for tasks assessed/performed      Past Medical History  Diagnosis Date  . Hypertension   . Colon polyp   . Diabetes mellitus     Type 2  . Hyperlipemia   . Sleep apnea     wears CPAP mask nightly     Past Surgical History  Procedure Laterality Date  . Colonoscopy    . Shoulder arthroscopy with rotator cuff repair and subacromial decompression Right 02/21/2014    Procedure: RIGHT SHOULDER ARTHROSCOPYSUBACROMIAL DECOMPRESSION WITH EXTENSIVE DEBRIDEMENT;  Surgeon: Marianna Payment, MD;  Location: [place];  Service: Orthopedics;  Laterality: Right;    There were no vitals filed for this visit.  Visit Diagnosis:  Pain in joint, shoulder region, right  Shoulder joint stiffness, right      Subjective Assessment - 06/18/14 0931    Subjective I was sore after last visit. I am not noticing any major changes. Soreness goes away quickly. Reaching behing my back stayed for 1-2 days. Patient is back to everyday things. Pain with midrange for abduction is gone.    Limitations Lifting   How long can you sit comfortably? in a race car with upper body strapped in tightly.    Patient Stated Goals get right shoulder abuction stronger   Currently in Pain? Yes   Pain Score 2    Pain Location Shoulder   Pain Orientation Right   Pain Descriptors / Indicators Sharp   Pain Type Chronic pain   Pain Onset  More than a month ago   Pain Frequency Intermittent   Aggravating Factors  reaching behind his back   Pain Relieving Factors arm at side   Multiple Pain Sites No            [facility] PT Assessment - 06/18/14 0001    PROM   Overall PROM  --  reach to L2 with right hand for IR                   [facility] Adult PT Treatment/Exercise - 06/18/14 0001    Shoulder Exercises: ROM/Strengthening   UBE (Upper Arm Bike) L6x8 min   Ultrasound   Ultrasound Location anterior right shoulder   Ultrasound Parameters 50%, 1 mhz, 0.8w/cm2, 8 min.   Ultrasound Goals Pain   Manual Therapy   Manual Therapy Massage   Massage anterior right shoulder, biceps, deltoid, A-C joint, pectoralis   Myofascial Release to right shoulder   Passive ROM right shoulder IR                PT Education - 06/18/14 1009    Education provided No          PT Short Term Goals - 06/11/14 1159    PT SHORT TERM GOAL #3   Title reach behind his back with 4/10 pain or less   Time 4  Period Weeks   Status On-going  1/10           PT Long Term Goals - 06/18/14 1011    PT LONG TERM GOAL #2   Title be independent with advanced HEP for ROM and strength   Time 4   Period Weeks   Status On-going  stil llearning HEP   PT LONG TERM GOAL #6   Title demo 5/5 Rt shoulder strength   Time 4   Period Weeks   Status On-going  IR is weak and abd   PT LONG TERM GOAL #7   Title get in/out of race car without limitation due to strength and AROM of Rt UE   Time 4   Period Weeks   Status On-going               Plan - 06/18/14 1010    Clinical Impression Statement Patient has increased right shoulder IR for reaching behind his back with pulling instead of pain in right A-C joint   Pt will benefit from skilled therapeutic intervention in order to improve on the following deficits Impaired flexibility;Postural dysfunction;Decreased coordination;Increased fascial restricitons;Increased muscle  spasms;Decreased mobility;Decreased strength;Pain   Rehab Potential Good   Clinical Impairments Affecting Rehab Potential None   PT Frequency 2x / week   PT Duration 4 weeks   PT Treatment/Interventions Therapeutic activities;Patient/family education;Passive range of motion;Therapeutic exercise;Manual techniques;Neuromuscular re-education;Functional mobility training   PT Next Visit Plan ultrasound, work on IR, measure ER   PT Home Exercise Plan continue with present HEp   Consulted and Agree with Plan of Care Patient        Problem List Patient Active Problem List   Diagnosis Date Noted  . Personal history of colonic polyps-adenoma 06/20/2013    GRAY,CHERYL,PT 06/18/2014, 10:13 AM  [facility] [address] [place], [place], [address] Phone: [phone]   Fax:  [phone]

## 2014-06-20 ENCOUNTER — Ambulatory Visit: Payer: BLUE CROSS/BLUE SHIELD | Admitting: Physical Therapy

## 2014-06-20 ENCOUNTER — Encounter: Payer: Self-pay | Admitting: Physical Therapy

## 2014-06-20 DIAGNOSIS — Z4789 Encounter for other orthopedic aftercare: Secondary | ICD-10-CM | POA: Diagnosis not present

## 2014-06-20 DIAGNOSIS — R29898 Other symptoms and signs involving the musculoskeletal system: Secondary | ICD-10-CM

## 2014-06-20 DIAGNOSIS — M25611 Stiffness of right shoulder, not elsewhere classified: Secondary | ICD-10-CM

## 2014-06-20 DIAGNOSIS — M25511 Pain in right shoulder: Secondary | ICD-10-CM

## 2014-06-20 NOTE — Therapy (Signed)
[facility] [address] [place], [place], [address] Phone: [phone]   Fax:  [phone]  Physical Therapy Treatment  Patient Details  Name: Zachary Dunn MRN: [id number] Date of Birth: Jan 25, 1951 Referring Provider:  Leandrew Koyanagi, MD  Encounter Date: 06/20/2014      PT End of Session - 06/20/14 0927    Visit Number 24   Date for PT Re-Evaluation 06/29/14   PT Start Time 0845   PT Stop Time 0930   PT Time Calculation (min) 45 min   Activity Tolerance Patient tolerated treatment well   Behavior During Therapy [facility] for tasks assessed/performed      Past Medical History  Diagnosis Date  . Hypertension   . Colon polyp   . Diabetes mellitus     Type 2  . Hyperlipemia   . Sleep apnea     wears CPAP mask nightly     Past Surgical History  Procedure Laterality Date  . Colonoscopy    . Shoulder arthroscopy with rotator cuff repair and subacromial decompression Right 02/21/2014    Procedure: RIGHT SHOULDER ARTHROSCOPYSUBACROMIAL DECOMPRESSION WITH EXTENSIVE DEBRIDEMENT;  Surgeon: Marianna Payment, MD;  Location: [place];  Service: Orthopedics;  Laterality: Right;    There were no vitals filed for this visit.  Visit Diagnosis:  Pain in joint, shoulder region, right  Shoulder joint stiffness, right  Weakness of shoulder      Subjective Assessment - 06/20/14 0853    Subjective I can reach behind my back easier and further. I have no pain this morning. I feel good with hands on for therapy today.    Limitations Lifting  lifting out to the side   Patient Stated Goals get right shoulder abuction stronger   Currently in Pain? No/denies                         [facility] Adult PT Treatment/Exercise - 06/20/14 0001    Shoulder Exercises: ROM/Strengthening   UBE (Upper Arm Bike) L6x8 min   Ultrasound   Ultrasound Location anterior right shoulder   Ultrasound Parameters 50%, 64mhz, 0.8 w/cm2,  8 min   Manual Therapy   Massage anterior right shoulder, biceps, deltoid, A-C joint, pectoralis   Myofascial Release to right shoulder   Passive ROM right shoulder IR                PT Education - 06/20/14 0927    Education provided No          PT Short Term Goals - 06/11/14 1159    PT SHORT TERM GOAL #3   Title reach behind his back with 4/10 pain or less   Time 4   Period Weeks   Status On-going  1/10           PT Long Term Goals - 06/18/14 1011    PT LONG TERM GOAL #2   Title be independent with advanced HEP for ROM and strength   Time 4   Period Weeks   Status On-going  stil llearning HEP   PT LONG TERM GOAL #6   Title demo 5/5 Rt shoulder strength   Time 4   Period Weeks   Status On-going  IR is weak and abd   PT LONG TERM GOAL #7   Title get in/out of race car without limitation due to strength and AROM of Rt UE   Time 4   Period Weeks  Status On-going               Plan - 06/20/14 9244    Clinical Impression Statement Patient continues to increased right shoulder IR.  Patient has a tight right anterior capsule.    Pt will benefit from skilled therapeutic intervention in order to improve on the following deficits Impaired flexibility;Postural dysfunction;Decreased coordination;Increased fascial restricitons;Increased muscle spasms;Decreased mobility;Decreased strength;Pain   Rehab Potential Good   Clinical Impairments Affecting Rehab Potential None   PT Frequency 2x / week   PT Duration --  1 week   PT Treatment/Interventions Therapeutic activities;Patient/family education;Passive range of motion;Therapeutic exercise;Manual techniques;Neuromuscular re-education;Functional mobility training   PT Next Visit Plan ultrasound, work on IR, measure ER   PT Home Exercise Plan continue with present HEp   Consulted and Agree with Plan of Care Patient        Problem List Patient Active Problem List   Diagnosis Date Noted  . Personal  history of colonic polyps-adenoma 06/20/2013    [name],PT 06/20/2014, 9:30 AM  [facility] [address] [place], [place], [address] Phone: [phone]   Fax:  [phone]

## 2014-06-25 ENCOUNTER — Encounter: Payer: Self-pay | Admitting: Physical Therapy

## 2014-06-25 ENCOUNTER — Ambulatory Visit: Payer: BLUE CROSS/BLUE SHIELD | Admitting: Physical Therapy

## 2014-06-25 DIAGNOSIS — Z4789 Encounter for other orthopedic aftercare: Secondary | ICD-10-CM | POA: Diagnosis not present

## 2014-06-25 DIAGNOSIS — M25511 Pain in right shoulder: Secondary | ICD-10-CM

## 2014-06-25 NOTE — Therapy (Signed)
[facility] [address], [place], [place], [address] Phone: [phone]   Fax:  [phone]  Physical Therapy Treatment  Patient Details  Name: Zachary Dunn MRN: [id number] Date of Birth: 07-Dec-1950 Referring Provider:  Leandrew Koyanagi, MD  Encounter Date: 06/25/2014      PT End of Session - 06/25/14 0852    Visit Number 25   Number of Visits 16   Date for PT Re-Evaluation 06/29/14   PT Start Time 0845   PT Stop Time 0930   PT Time Calculation (min) 45 min   Activity Tolerance Patient tolerated treatment well   Behavior During Therapy [facility] for tasks assessed/performed      Past Medical History  Diagnosis Date  . Hypertension   . Colon polyp   . Diabetes mellitus     Type 2  . Hyperlipemia   . Sleep apnea     wears CPAP mask nightly     Past Surgical History  Procedure Laterality Date  . Colonoscopy    . Shoulder arthroscopy with rotator cuff repair and subacromial decompression Right 02/21/2014    Procedure: RIGHT SHOULDER ARTHROSCOPYSUBACROMIAL DECOMPRESSION WITH EXTENSIVE DEBRIDEMENT;  Surgeon: Marianna Payment, MD;  Location: [place];  Service: Orthopedics;  Laterality: Right;    There were no vitals filed for this visit.  Visit Diagnosis:  Pain in joint, shoulder region, right          [facility] PT Assessment - 06/25/14 0001    Assessment   Medical Diagnosis S/P Right shoulder scope debridement   Balance Screen   Has the patient fallen in the past 6 months No   Has the patient had a decrease in activity level because of a fear of falling?  No   Is the patient reluctant to leave their home because of a fear of falling?  No   Prior Function   Level of Independence Independent with basic ADLs   Observation/Other Assessments   Focus on Therapeutic Outcomes (FOTO)  29% limitation   AROM   Right Shoulder Flexion 155 Degrees   Right Shoulder ABduction 165 Degrees   Strength   Strength  Assessment Site Shoulder   Right/Left Shoulder Right   Right Shoulder Flexion 5/5   Right Shoulder Extension 5/5   Right Shoulder ABduction 4+/5   Right Shoulder Internal Rotation 5/5   Right Shoulder External Rotation 5/5                     [facility] Adult PT Treatment/Exercise - 06/25/14 0001    Shoulder Exercises: Prone   Extension Strengthening;Right;20 reps;Weights   Extension Weight (lbs) 1   External Rotation Strengthening;Right;20 reps   External Rotation Limitations 1   Horizontal ABduction 1 Strengthening;Right;15 reps;Weights   Horizontal ABduction 1 Weight (lbs) 1   Manual Therapy   Manual Therapy Massage   Massage anterior right shoulder, biceps, deltoid, A-C joint, pectoralis   Myofascial Release to right shoulder   Passive ROM right shoulder IR                PT Education - 06/25/14 0926    Education provided Yes   Education Details Reviewed right shoulder RTC HEP and internal range of motion    Person(s) Educated Patient   Methods Explanation;Demonstration;Verbal cues   Comprehension Verbalized understanding;Returned demonstration          PT Short Term Goals - 06/25/14 0930    PT SHORT TERM GOAL #3  Title reach behind his back with 4/10 pain or less   Time 4   Period Weeks   Status On-going   PT SHORT TERM GOAL #4   Title demo improved Rt shoulder ER to 45 degrees or better to improve function   Time 4   Period Weeks   Status Achieved           PT Long Term Goals - 06/25/14 0931    PT LONG TERM GOAL #2   Title be independent with advanced HEP for ROM and strength   Time 4   Period Weeks   Status Achieved   PT LONG TERM GOAL #3   Title demo improved Rt shoulder flexion and abduction to 150 degrees to better normalize ADLs   Time 4   Period Weeks   Status Achieved   PT LONG TERM GOAL #4   Title reach behind his back with Rt arm for ADLs with minimal discomfort   Time 4   Period Weeks   Status Achieved   PT LONG  TERM GOAL #5   Title demo Rt shoulder ER to [facility] to perform ADLs   Time 8   Period Weeks   Status Achieved   PT LONG TERM GOAL #6   Title demo 5/5 Rt shoulder strength   Time 4   Period Weeks   Status Achieved   PT LONG TERM GOAL #7   Title get in/out of race car without limitation due to strength and AROM of Rt UE   Time 4   Period Weeks   Status Achieved               Plan - 06/25/14 3532    Clinical Impression Statement Patient is a 64 year old male s/p right shoulder decompression.  Patient has full strength of right shoulder.  Patient has pain at endrange of right shoulder fleixon, abduction, and internal rotation. Patient is still working on internal rotation and the most range he has gotten is reaching to L3. Patient has a tight right  anterior  capsule.  Patient  will continue to work on shoulder range of motion and scapular strength at home.    Pt will benefit from skilled therapeutic intervention in order to improve on the following deficits Impaired flexibility;Postural dysfunction;Decreased coordination;Increased fascial restricitons;Increased muscle spasms;Decreased mobility;Decreased strength;Pain   Rehab Potential Good   Clinical Impairments Affecting Rehab Potential None   PT Treatment/Interventions Therapeutic activities;Patient/family education;Passive range of motion;Therapeutic exercise;Manual techniques;Neuromuscular re-education;Functional mobility training   PT Next Visit Plan discharge to [place] continue wot current HEP   Consulted and Agree with Plan of Care Patient        Problem List Patient Active Problem List   Diagnosis Date Noted  . Personal history of colonic polyps-adenoma 06/20/2013    GRAY,CHERYL,PT 06/25/2014, 10:40 AM  [facility] [address] [place], [place], [address] Phone: [phone]   Fax:  [phone]  PHYSICAL THERAPY DISCHARGE  SUMMARY  Visits from Start of Care: 25 Current functional level related to goals / functional outcomes: See above for progress of goals.  Patient has met all goals.    Remaining deficits: Patient continues to have a tight anterior capsule of right shoulder restricting his internal rotation and pain with endrange motion of right shoulder.    Education / Equipment: HEP emphasizing internal rotation stretch and scapular strengthening.  Plan: Patient agrees to discharge.  Patient goals were  met. Patient is being discharged due to meeting the stated rehab goals.  Thank you for the referral. Earlie Counts, PT 06/25/2014 10:39 AM ?????

## 2016-07-21 ENCOUNTER — Encounter: Payer: Self-pay | Admitting: Internal Medicine
